# Patient Record
Sex: Female | Born: 1990 | Race: Black or African American | Hispanic: No | Marital: Married | Smoking: Never smoker
Health system: Southern US, Community
[De-identification: ages and names within clinical notes are randomized; demographics above are authoritative.]

## PROBLEM LIST (undated history)

## (undated) DIAGNOSIS — Z973 Presence of spectacles and contact lenses: Secondary | ICD-10-CM

## (undated) DIAGNOSIS — R55 Syncope and collapse: Secondary | ICD-10-CM

## (undated) DIAGNOSIS — F419 Anxiety disorder, unspecified: Secondary | ICD-10-CM

## (undated) DIAGNOSIS — K644 Residual hemorrhoidal skin tags: Secondary | ICD-10-CM

## (undated) DIAGNOSIS — D219 Benign neoplasm of connective and other soft tissue, unspecified: Secondary | ICD-10-CM

## (undated) DIAGNOSIS — D649 Anemia, unspecified: Secondary | ICD-10-CM

## (undated) DIAGNOSIS — O24419 Gestational diabetes mellitus in pregnancy, unspecified control: Secondary | ICD-10-CM

## (undated) HISTORY — PX: NO PAST SURGERIES: SHX2092

## (undated) HISTORY — DX: Residual hemorrhoidal skin tags: K64.4

## (undated) HISTORY — DX: Gestational diabetes mellitus in pregnancy, unspecified control: O24.419

## (undated) HISTORY — DX: Anemia, unspecified: D64.9

## (undated) HISTORY — DX: Benign neoplasm of connective and other soft tissue, unspecified: D21.9

---

## 2012-05-15 ENCOUNTER — Emergency Department (HOSPITAL_COMMUNITY): Payer: Self-pay

## 2012-05-15 ENCOUNTER — Emergency Department (HOSPITAL_COMMUNITY)
Admission: EM | Admit: 2012-05-15 | Discharge: 2012-05-16 | Disposition: A | Discharge status: Home / Self Care | Payer: Self-pay | Attending: Emergency Medicine | Admitting: Emergency Medicine

## 2012-05-15 DIAGNOSIS — S62609A Fracture of unspecified phalanx of unspecified finger, initial encounter for closed fracture: Secondary | ICD-10-CM | POA: Insufficient documentation

## 2012-05-15 DIAGNOSIS — Y9239 Other specified sports and athletic area as the place of occurrence of the external cause: Secondary | ICD-10-CM | POA: Insufficient documentation

## 2012-05-15 DIAGNOSIS — Y9389 Activity, other specified: Secondary | ICD-10-CM | POA: Insufficient documentation

## 2012-05-15 DIAGNOSIS — X500XXA Overexertion from strenuous movement or load, initial encounter: Secondary | ICD-10-CM | POA: Insufficient documentation

## 2012-05-15 DIAGNOSIS — Z79899 Other long term (current) drug therapy: Secondary | ICD-10-CM | POA: Insufficient documentation

## 2012-05-15 MED ORDER — HYDROCODONE-ACETAMINOPHEN 5-325 MG PO TABS
1.0000 | ORAL_TABLET | Freq: Once | ORAL | Status: AC
  Administered 2012-05-15: 1 via ORAL
  Filled 2012-05-15: qty 1

## 2012-05-15 MED ORDER — HYDROCODONE-ACETAMINOPHEN 5-325 MG PO TABS: 1.0000 | ORAL_TABLET | ORAL | Status: DC | PRN

## 2012-05-15 NOTE — ED Notes (Signed)
Pt states she was playing around and tried to get up and put all her weight on her R hand and injured her R index finger. Pt states she heard a pop. Pt concerned her finger is broken. No deformity noted. Pt declines ice pack, states it made her finger feel worse. Pt arrives with companion.

## 2012-05-15 NOTE — ED Provider Notes (Signed)
Medical screening examination/treatment/procedure(s) were conducted as a shared visit with non-physician practitioner(s) and myself.  I personally evaluated the patient during the encounter  Full ROM of finger. Volar plate fracture. Home with splint and buddy tape  Lyanne Co, MD 05/15/12 2359

## 2012-05-15 NOTE — ED Provider Notes (Signed)
History  This chart was scribed for non-physician practitioner working with Kelli Co, MD by Ardeen Jourdain, ED Scribe. This patient was seen in room WTR9/WTR9 and the patient's care was started at 2311.  CSN: [id number]  Arrival date & time 05/15/12  2234   First MD Initiated Contact with Patient 05/15/12 2311      Chief Complaint  Patient presents with  . Finger Injury     Patient is a 22 y.o. female presenting with hand pain. The history is provided by the patient. No language interpreter was used.  Hand Pain This is a new problem. The current episode started 1 to 2 hours ago. The problem occurs constantly. The problem has been gradually worsening. Pertinent negatives include no chest pain, no abdominal pain, no headaches and no shortness of breath. The symptoms are aggravated by bending and twisting (Ice). Nothing relieves the symptoms. She has tried a cold compress for the symptoms. The treatment provided no relief.    Kelli Cobb is a 22 y.o. female who presents to the Emergency Department complaining of sudden onset, gradually worsening, constant right index finger and hand pain from an accident that occurred a few hours ago. She states she was "playing around, tried to get up, put all her weight on her right hand and heard a pop." She denies any other injury at this time. She states she is unable to move her finger.  No past medical history on file.  No past surgical history on file.  No family history on file.  History  Substance Use Topics  . Smoking status: Not on file  . Smokeless tobacco: Not on file  . Alcohol Use: Not on file   No OB history available.   Review of Systems  Constitutional: Negative for fever and chills.  Respiratory: Negative for shortness of breath.   Cardiovascular: Negative for chest pain.  Gastrointestinal: Negative for nausea, vomiting and abdominal pain.  Musculoskeletal:       Right index finger  Neurological: Negative for weakness  and headaches.  All other systems reviewed and are negative.    Allergies  Review of patient's allergies indicates no known allergies.  Home Medications   Current Outpatient Rx  Name  Route  Sig  Dispense  Refill  . BIOTIN PO   Oral   Take 1 tablet by mouth daily.         Marland Kitchen HYDROcodone-acetaminophen (NORCO/VICODIN) 5-325 MG per tablet   Oral   Take 1 tablet by mouth every 4 (four) hours as needed for pain.   6 tablet   0     Triage Vitals: BP 138/87  Pulse 104  Temp(Src) 98.9 F (37.2 C) (Oral)  Resp 20  SpO2 100%  Physical Exam  Nursing note and vitals reviewed. Constitutional: She is oriented to person, place, and time. She appears well-developed and well-nourished. No distress.  HENT:  Head: Normocephalic and atraumatic.  Eyes: EOM are normal. Pupils are equal, round, and reactive to light.  Neck: Normal range of motion. Neck supple. No tracheal deviation present.  Cardiovascular: Normal rate.   Pulmonary/Chest: Effort normal. No respiratory distress.  Abdominal: Soft. She exhibits no distension.  Musculoskeletal: She exhibits no edema.       Right hand: She exhibits decreased range of motion, tenderness, bony tenderness and swelling. She exhibits normal two-point discrimination, normal capillary refill, no deformity and no laceration. Normal sensation noted. Normal strength noted.       Hands: Pt pain worse at  the MIP joint.  Neurological: She is alert and oriented to person, place, and time.  Skin: Skin is warm and dry.  Psychiatric: She has a normal mood and affect. Her behavior is normal.    ED Course  Procedures (including critical care time)  DIAGNOSTIC STUDIES: Oxygen Saturation is 100% on room air, normal by my interpretation.    COORDINATION OF CARE:  11:36 PM: iscussed treatment plan which includes x-ray of the right index finger with pt at bedside and pt agreed to plan.  Dr. Patria Mane came to see patient to help evaluate pt tendons intact. Which  they are. Pt refused digital block. Fingers buddy tapped. Pt education given.   Labs Reviewed - No data to display Dg Finger Index Right  05/15/2012  *RADIOLOGY REPORT*  Clinical Data: The patient fell on the right index finger and is unable to straighten it.  RIGHT INDEX FINGER 2+V  Comparison: None.  Findings: The proximal interphalangeal joint of the right second finger is fixed in flexion.  There is an avulsed volar plate fracture extending to the articular surface at the proximal aspect of the middle phalanx.  Mild soft tissue swelling.  No radiopaque soft tissue foreign bodies.  No focal bone lesion or bone destruction.  IMPRESSION: Intra-articular volar plate fracture of the proximal aspect of the middle phalanx of the right second finger with fixed flexion deformity of the proximal interphalangeal joint.   Original Report Authenticated By: Burman Nieves, M.D.      1. Finger fracture, closed, initial encounter       MDM  Pt has been advised of the symptoms that warrant their return to the ED. Patient has voiced understanding and has agreed to follow-up with the PCP or specialist.  I personally performed the services described in this documentation, which was scribed in my presence. The recorded information has been reviewed and is accurate.    Dorthula Matas, PA-C 05/15/12 2353

## 2012-11-12 ENCOUNTER — Emergency Department (HOSPITAL_COMMUNITY)
Admission: EM | Admit: 2012-11-12 | Discharge: 2012-11-12 | Disposition: A | Discharge status: Home / Self Care | Payer: No Typology Code available for payment source | Attending: Emergency Medicine | Admitting: Emergency Medicine

## 2012-11-12 DIAGNOSIS — R509 Fever, unspecified: Secondary | ICD-10-CM | POA: Insufficient documentation

## 2012-11-12 DIAGNOSIS — K029 Dental caries, unspecified: Secondary | ICD-10-CM | POA: Insufficient documentation

## 2012-11-12 DIAGNOSIS — K0889 Other specified disorders of teeth and supporting structures: Secondary | ICD-10-CM

## 2012-11-12 DIAGNOSIS — K0520 Aggressive periodontitis, unspecified: Secondary | ICD-10-CM | POA: Insufficient documentation

## 2012-11-12 MED ORDER — HYDROCODONE-ACETAMINOPHEN 5-325 MG PO TABS: 1.0000 | ORAL_TABLET | ORAL | Status: DC | PRN

## 2012-11-12 MED ORDER — PENICILLIN V POTASSIUM 500 MG PO TABS: 500.0000 mg | ORAL_TABLET | Freq: Three times a day (TID) | ORAL | Status: DC

## 2012-11-12 NOTE — ED Notes (Signed)
Pt c/o pain to R side bottom molar area of mouth. Pt states she has a cracked tooth and abscess to her gums. Pt states she has an appt in 3 weeks with dentist. Pt also c/o fever and chills today. Pt with no acute distress. Pt ambulatory to exam room with steady gait.

## 2012-11-12 NOTE — ED Provider Notes (Signed)
CSN: [id number]     Arrival date & time 11/12/12  1947 History  This chart was scribed for non-physician practitioner, Catie Tashanti Dalporto, PA-C working with Shon Baton, MD by Greggory Stallion, ED scribe. This patient was seen in room WTR7/WTR7 and the patient's care was started at 9:08 PM.   Chief Complaint  Patient presents with  . Dental Pain   The history is provided by the patient. No language interpreter was used.    HPI Comments: Kelli Cobb is a 22 y.o. female who presents to the Emergency Department complaining of gradual onset, constant right lower dental pain that started 4 days ago. Pt states she started getting a fever earlier today, max temp 100.0. She states she has a cracked tooth that caused an abscess. Pt is not sure if there is any drainage. She has taken Tylenol with no relief. Pt states palpation worsens the pain. She denies any other associated symptoms. Pt states she has a dentist appointment in 2-3 weeks to get the tooth pulled.   No past medical history on file. No past surgical history on file. No family history on file. History  Substance Use Topics  . Smoking status: Not on file  . Smokeless tobacco: Not on file  . Alcohol Use: Not on file   OB History   No data available     Review of Systems  Constitutional: Positive for fever.  HENT: Positive for dental problem.   All other systems reviewed and are negative.    Allergies  Review of patient's allergies indicates no known allergies.  Home Medications   Current Outpatient Rx  Name  Route  Sig  Dispense  Refill  . BIOTIN PO   Oral   Take 1 tablet by mouth daily.         Marland Kitchen HYDROcodone-acetaminophen (NORCO/VICODIN) 5-325 MG per tablet   Oral   Take 1 tablet by mouth every 4 (four) hours as needed for pain.   6 tablet   0    BP 130/74  Pulse 96  Temp(Src) 99.4 F (37.4 C) (Oral)  Resp 16  SpO2 100%  Physical Exam  Nursing note and vitals reviewed. Constitutional: She is oriented  to person, place, and time. She appears well-developed and well-nourished. No distress.  HENT:  Head: Normocephalic and atraumatic.  Right lower second molar has advanced carries and there is a 0.5 cm fluctuant abscess of adjacent gingiva. The tooth is tender to palpation. No edema of buccal mucosa.   Eyes: EOM are normal.  Neck: Neck supple. No tracheal deviation present.  Cardiovascular: Normal rate, regular rhythm and normal heart sounds.   Pulmonary/Chest: Effort normal and breath sounds normal. No respiratory distress. She has no wheezes. She has no rales.  Musculoskeletal: Normal range of motion.  Neurological: She is alert and oriented to person, place, and time.  Skin: Skin is warm and dry.  Psychiatric: She has a normal mood and affect. Her behavior is normal.    ED Course  NERVE BLOCK Date/Time: 11/12/2012 9:54 PM Performed by: Otilio Miu Authorized by: Ruby Cola E Consent: Verbal consent obtained. Risks and benefits: risks, benefits and alternatives were discussed Consent given by: patient Patient understanding: patient states understanding of the procedure being performed Indications: pain relief Body area: face/mouth (periapical) Laterality: right Patient sedated: no Local anesthetic: bupivacaine 0.5% with epinephrine Anesthetic total: 1.8 (1.8) ml Outcome: pain improved Patient tolerance: Patient tolerated the procedure well with no immediate complications. Comments: R lower second molar   (  including critical care time)  DIAGNOSTIC STUDIES: Oxygen Saturation is 100% on RA, normal by my interpretation.    COORDINATION OF CARE: 9:13 PM-Discussed treatment plan which includes dental block and I&D with pt at bedside and pt agreed to plan. Advised pt to keep her dentist appointment and follow up there.   Labs Review Labs Reviewed - No data to display Imaging Review No results found.  MDM   1. Pain, dental    22yo healthy F presents w/  abscess of gingiva, adjacent to right lower 2nd molar.  Periapical block performed successfully and needle aspiration of abscess performed.  Pt d/c'd home w/ penicillin and 10 vicodin.  Referred to dentist on call. Return precautions discussed. 9:53 PM    I personally performed the services described in this documentation, which was scribed in my presence. The recorded information has been reviewed and is accurate.   Otilio Miu, PA-C 11/12/12 2157

## 2012-11-13 NOTE — ED Provider Notes (Signed)
Medical screening examination/treatment/procedure(s) were performed by non-physician practitioner and as supervising physician I was immediately available for consultation/collaboration.  Courtney F Horton, MD 11/13/12 0900 

## 2012-11-19 ENCOUNTER — Encounter (HOSPITAL_BASED_OUTPATIENT_CLINIC_OR_DEPARTMENT_OTHER): Payer: Self-pay | Admitting: Emergency Medicine

## 2012-11-19 ENCOUNTER — Emergency Department (HOSPITAL_BASED_OUTPATIENT_CLINIC_OR_DEPARTMENT_OTHER)
Admission: EM | Admit: 2012-11-19 | Discharge: 2012-11-20 | Disposition: A | Discharge status: Home / Self Care | Payer: No Typology Code available for payment source | Attending: Emergency Medicine | Admitting: Emergency Medicine

## 2012-11-19 DIAGNOSIS — R0789 Other chest pain: Secondary | ICD-10-CM | POA: Insufficient documentation

## 2012-11-19 DIAGNOSIS — F410 Panic disorder [episodic paroxysmal anxiety] without agoraphobia: Secondary | ICD-10-CM | POA: Insufficient documentation

## 2012-11-19 DIAGNOSIS — R55 Syncope and collapse: Secondary | ICD-10-CM | POA: Insufficient documentation

## 2012-11-19 DIAGNOSIS — Z792 Long term (current) use of antibiotics: Secondary | ICD-10-CM | POA: Insufficient documentation

## 2012-11-19 DIAGNOSIS — R209 Unspecified disturbances of skin sensation: Secondary | ICD-10-CM | POA: Insufficient documentation

## 2012-11-19 DIAGNOSIS — R0602 Shortness of breath: Secondary | ICD-10-CM | POA: Insufficient documentation

## 2012-11-19 DIAGNOSIS — Z3202 Encounter for pregnancy test, result negative: Secondary | ICD-10-CM | POA: Insufficient documentation

## 2012-11-19 DIAGNOSIS — H538 Other visual disturbances: Secondary | ICD-10-CM | POA: Insufficient documentation

## 2012-11-19 HISTORY — DX: Syncope and collapse: R55

## 2012-11-19 HISTORY — DX: Anxiety disorder, unspecified: F41.9

## 2012-11-19 LAB — URINALYSIS, ROUTINE W REFLEX MICROSCOPIC
Bilirubin Urine: NEGATIVE
Leukocytes, UA: NEGATIVE
Nitrite: NEGATIVE
Specific Gravity, Urine: 1.024 (ref 1.005–1.030)
Urobilinogen, UA: 1 mg/dL (ref 0.0–1.0)
pH: 6.5 (ref 5.0–8.0)

## 2012-11-19 LAB — URINE MICROSCOPIC-ADD ON

## 2012-11-19 LAB — PREGNANCY, URINE: Preg Test, Ur: NEGATIVE
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
## 2012-11-20 MED ORDER — LORAZEPAM 1 MG PO TABS
1.0000 mg | ORAL_TABLET | Freq: Once | ORAL | Status: AC
  Administered 2012-11-20: 1 mg via ORAL
  Filled 2012-11-20: qty 1

## 2012-11-20 NOTE — ED Notes (Signed)
Pt d/c'ed at 00:23. Epic was taken offline at that time so unable to d/c pt until now.

## 2013-06-10 ENCOUNTER — Ambulatory Visit: Payer: Self-pay | Admitting: Physician Assistant

## 2015-03-12 ENCOUNTER — Ambulatory Visit (INDEPENDENT_AMBULATORY_CARE_PROVIDER_SITE_OTHER): Payer: Managed Care, Other (non HMO) | Admitting: Sports Medicine

## 2015-03-12 ENCOUNTER — Encounter: Payer: Self-pay | Admitting: Sports Medicine

## 2015-03-12 ENCOUNTER — Ambulatory Visit (INDEPENDENT_AMBULATORY_CARE_PROVIDER_SITE_OTHER): Payer: Managed Care, Other (non HMO)

## 2015-03-12 DIAGNOSIS — M2141 Flat foot [pes planus] (acquired), right foot: Secondary | ICD-10-CM

## 2015-03-12 DIAGNOSIS — M2142 Flat foot [pes planus] (acquired), left foot: Secondary | ICD-10-CM

## 2015-03-12 DIAGNOSIS — M79673 Pain in unspecified foot: Secondary | ICD-10-CM

## 2015-03-12 NOTE — Progress Notes (Signed)
Patient ID: Kelli Cobb, female   DOB: 08-22-1990, 25 y.o.   MRN: [id number] Subjective: Kelli Cobb is a 25 y.o. female patient who presents to office for evaluation of bilateral foot pain. Patient states that her feet in general, just her top to bottom. States that this has been going on for over 2 years. States that the pain is feels like achy sensation that involves her entire foot. States that sometimes it feels like she is walking on all her bones states that she hobble sometimes in the morning or either at night.Because her feet just hurt states that she has tried various types of shoes, padding, and OTC orthotics; states that the memory foam to help the most. Patient denies any other pedal complaints. Denies injury or any other causative factors.   Works at [employer]  There are no active problems to display for this patient.  Current Outpatient Prescriptions on File Prior to Visit  Medication Sig Dispense Refill  . acetaminophen (TYLENOL) 500 MG tablet Take 500 mg by mouth every 6 (six) hours as needed for pain.     No current facility-administered medications on file prior to visit.   No Known Allergies   Objective:  General: Alert and oriented x3 in no acute distress  Dermatology: No open lesions bilateral lower extremities, no webspace macerations, no ecchymosis bilateral, all nails x 10 are well manicured.  Vascular: Dorsalis Pedis and Posterior Tibial pedal pulses 2/4, Capillary Fill Time 3 seconds, (+) pedal hair growth bilateral, no edema bilateral lower extremities, Temperature gradient within normal limits.  Neurology: Gross sensation intact via light touch bilateral, No babinski sign present bilateral. (-) Tinels sign.  Musculoskeletal: No tenderness with palpation along medial arch, medial fascial band, No tenderness along Posterior tibial tendon course with medial soft tissue buldge noted, there is mild decreased ankle rom with knee extending  vs flexed resembling gastroc  equnius bilateral, Subtalar joint range of motion is within normal limits, there is no 1st ray hypermobility noted bilateral, there is medial arch collapse bilateral on weightbearing exam, rear foot neutral, no "too-many toes" sign appreciated, able to perform heel rise without pain or discomfort    Xrays  Left/Right Foot: Normal osseous mineralization. Joint spaces preserved. No fracture/dislocation/boney destruction. Mild 1st ray elevatus present. Minimally Increased Talar head uncovering present. Anterior break in cyma line without midtarsal breach present. Minimal Increased Talar declination present. Minimal Decreased calcaneal inclination present.  No soft tissue abnormalities or radiopaque foreign bodies.   Assessment and Plan: Problem List Items Addressed This Visit    None    Visit Diagnoses    Foot pain, unspecified laterality    -  Primary    Relevant Orders    DG Foot 2 Views Left    DG Foot 2 Views Right    Pes planus of both feet           -Complete examination performed -Xrays reviewed -Discussed treatement options; discussed pes planus deformity -Recommend good supportive shoes and inserts daily and to decrease use of wearing high heels, which can cause fatigue and other symptoms to feet -Recommend custom foot orthotics. Patient was scanned today. We will hold prescription until a cost breakdown for orthotics is given to patient. Patient will like to know the cost of orthotics before sending the prescription to [place] lab. -Patient to return to office to pick up orthotics/ as needed or sooner if condition worsens.  Landis Martins, DPM

## 2015-03-15 ENCOUNTER — Telehealth: Payer: Self-pay | Admitting: Sports Medicine

## 2015-03-15 NOTE — Telephone Encounter (Signed)
03/15/2015- called [employer] and verified orthotic benefits for L3020/ L3030. Both are eligible for benefits, no pre cert required, unlimited amount based on medical necessity. The plan pays 80% of allowed charge, patient liab for 20% coins.  Called pt on cell and left her vm to call us and advise if she wants [place] to go ahead and place the order for these.

## 2015-03-15 NOTE — Telephone Encounter (Signed)
Thank you -Dr. Nasreen Goedecke 

## 2016-01-08 ENCOUNTER — Encounter: Payer: Self-pay | Admitting: Emergency Medicine

## 2016-01-08 ENCOUNTER — Observation Stay
Admission: EM | Admit: 2016-01-08 | Discharge: 2016-01-09 | Disposition: A | Discharge status: Home / Self Care | Payer: Managed Care, Other (non HMO) | Attending: Internal Medicine | Admitting: Internal Medicine

## 2016-01-08 ENCOUNTER — Emergency Department: Payer: Managed Care, Other (non HMO)

## 2016-01-08 DIAGNOSIS — R000 Tachycardia, unspecified: Secondary | ICD-10-CM | POA: Diagnosis not present

## 2016-01-08 DIAGNOSIS — R0781 Pleurodynia: Secondary | ICD-10-CM | POA: Diagnosis not present

## 2016-01-08 DIAGNOSIS — F419 Anxiety disorder, unspecified: Secondary | ICD-10-CM | POA: Insufficient documentation

## 2016-01-08 DIAGNOSIS — R079 Chest pain, unspecified: Secondary | ICD-10-CM | POA: Diagnosis present

## 2016-01-08 DIAGNOSIS — Z833 Family history of diabetes mellitus: Secondary | ICD-10-CM | POA: Insufficient documentation

## 2016-01-08 DIAGNOSIS — R0602 Shortness of breath: Secondary | ICD-10-CM | POA: Diagnosis not present

## 2016-01-08 DIAGNOSIS — Z8249 Family history of ischemic heart disease and other diseases of the circulatory system: Secondary | ICD-10-CM | POA: Diagnosis not present

## 2016-01-08 DIAGNOSIS — I10 Essential (primary) hypertension: Secondary | ICD-10-CM | POA: Diagnosis not present

## 2016-01-08 LAB — TROPONIN I: Troponin I: <0.03 ng/mL (ref <0.03)

## 2016-01-08 LAB — COMPREHENSIVE METABOLIC PANEL
ALBUMIN: 3.8 g/dL (ref 3.5–5.0)
ALK PHOS: 50 U/L (ref 38–126)
ALT: 25 U/L (ref 14–54)
ANION GAP: 7 (ref 5–15)
AST: 26 U/L (ref 15–41)
BILIRUBIN TOTAL: 0.4 mg/dL (ref 0.3–1.2)
BUN: 8 mg/dL (ref 6–20)
CALCIUM: 8.9 mg/dL (ref 8.9–10.3)
CO2: 25 mmol/L (ref 22–32)
CREATININE: 0.68 mg/dL (ref 0.44–1.00)
Chloride: 104 mmol/L (ref 101–111)
GFR calc Af Amer: >60 mL/min (ref >60)
GFR calc non Af Amer: >60 mL/min (ref >60)
GLUCOSE: 134 mg/dL — AB (ref 65–99)
Potassium: 3.7 mmol/L (ref 3.5–5.1)
SODIUM: 136 mmol/L (ref 135–145)
TOTAL PROTEIN: 7.2 g/dL (ref 6.5–8.1)

## 2016-01-08 LAB — CBC
HEMATOCRIT: 42.1 % (ref 35.0–47.0)
HEMOGLOBIN: 14 g/dL (ref 12.0–16.0)
MCH: 26.3 pg (ref 26.0–34.0)
MCHC: 33.3 g/dL (ref 32.0–36.0)
MCV: 79 fL — ABNORMAL LOW (ref 80.0–100.0)
Platelets: 306 10*3/uL (ref 150–440)
RBC: 5.33 MIL/uL — AB (ref 3.80–5.20)
RDW: 14.4 % (ref 11.5–14.5)
WBC: 4.7 10*3/uL (ref 3.6–11.0)

## 2016-01-08 LAB — POCT PREGNANCY, URINE: PREG TEST UR: NEGATIVE

## 2016-01-08 LAB — FIBRIN DERIVATIVES D-DIMER (ARMC ONLY): Fibrin derivatives D-dimer (ARMC): 220 (ref 0–499)

## 2016-01-08 MED ORDER — ONDANSETRON HCL 4 MG/2ML IJ SOLN: 4.0000 mg | Freq: Four times a day (QID) | INTRAMUSCULAR | Status: DC | PRN

## 2016-01-08 MED ORDER — HYDROCODONE-ACETAMINOPHEN 5-325 MG PO TABS
1.0000 | ORAL_TABLET | ORAL | Status: DC | PRN
  Administered 2016-01-08: 1 via ORAL
  Filled 2016-01-08: qty 1

## 2016-01-08 MED ORDER — ALBUTEROL SULFATE (2.5 MG/3ML) 0.083% IN NEBU: 2.5000 mg | INHALATION_SOLUTION | RESPIRATORY_TRACT | Status: DC | PRN

## 2016-01-08 MED ORDER — IOPAMIDOL (ISOVUE-370) INJECTION 76%
75.0000 mL | Freq: Once | INTRAVENOUS | Status: AC | PRN
  Administered 2016-01-08: 75 mL via INTRAVENOUS

## 2016-01-08 MED ORDER — ACETAMINOPHEN 650 MG RE SUPP: 650.0000 mg | Freq: Four times a day (QID) | RECTAL | Status: DC | PRN

## 2016-01-08 MED ORDER — METOPROLOL TARTRATE 25 MG PO TABS
12.5000 mg | ORAL_TABLET | Freq: Two times a day (BID) | ORAL | Status: DC
  Administered 2016-01-08 – 2016-01-09 (×2): 12.5 mg via ORAL
  Filled 2016-01-08: qty 1
  Filled 2016-01-08: qty 2
  Filled 2016-01-08: qty 1

## 2016-01-08 MED ORDER — IPRATROPIUM-ALBUTEROL 0.5-2.5 (3) MG/3ML IN SOLN
3.0000 mL | Freq: Once | RESPIRATORY_TRACT | Status: AC
  Administered 2016-01-08: 3 mL via RESPIRATORY_TRACT
  Filled 2016-01-08: qty 3

## 2016-01-08 MED ORDER — SODIUM CHLORIDE 0.9 % IV SOLN: 250.0000 mL | INTRAVENOUS | Status: DC | PRN

## 2016-01-08 MED ORDER — ONDANSETRON HCL 4 MG PO TABS: 4.0000 mg | ORAL_TABLET | Freq: Four times a day (QID) | ORAL | Status: DC | PRN

## 2016-01-08 MED ORDER — SODIUM CHLORIDE 0.9% FLUSH: 3.0000 mL | INTRAVENOUS | Status: DC | PRN

## 2016-01-08 MED ORDER — SODIUM CHLORIDE 0.9% FLUSH: 3.0000 mL | Freq: Two times a day (BID) | INTRAVENOUS | Status: DC

## 2016-01-08 MED ORDER — HEPARIN SODIUM (PORCINE) 5000 UNIT/ML IJ SOLN
5000.0000 [IU] | Freq: Three times a day (TID) | INTRAMUSCULAR | Status: DC
  Administered 2016-01-08 – 2016-01-09 (×2): 5000 [IU] via SUBCUTANEOUS
  Filled 2016-01-08 (×2): qty 1

## 2016-01-08 MED ORDER — SODIUM CHLORIDE 0.9% FLUSH
3.0000 mL | Freq: Two times a day (BID) | INTRAVENOUS | Status: DC
  Administered 2016-01-08 – 2016-01-09 (×2): 3 mL via INTRAVENOUS

## 2016-01-08 MED ORDER — ACETAMINOPHEN 325 MG PO TABS: 650.0000 mg | ORAL_TABLET | Freq: Four times a day (QID) | ORAL | Status: DC | PRN

## 2016-01-08 MED ORDER — KETOROLAC TROMETHAMINE 30 MG/ML IJ SOLN
30.0000 mg | Freq: Four times a day (QID) | INTRAMUSCULAR | Status: DC | PRN
  Administered 2016-01-09: 30 mg via INTRAVENOUS
  Filled 2016-01-08 (×2): qty 1

## 2016-01-08 MED ORDER — KETOROLAC TROMETHAMINE 30 MG/ML IJ SOLN
30.0000 mg | Freq: Once | INTRAMUSCULAR | Status: AC
  Administered 2016-01-08: 30 mg via INTRAVENOUS
  Filled 2016-01-08: qty 1

## 2016-01-08 NOTE — H&P (Signed)
[place] [facility] [place] NAME: Kelli Cobb    MR#:  [id number]  DATE OF BIRTH:  [birth year]  DATE OF ADMISSION:  01/08/2016  PRIMARY CARE PHYSICIAN: No PCP Per Patient   REQUESTING/REFERRING PHYSICIAN: Lavonia Drafts, MD  CHIEF COMPLAINT:   Chief Complaint  Patient presents with  . Chest Pain   Chest pain today. HISTORY OF PRESENT ILLNESS:  Kelli Cobb  is a 25 y.o. female with a known history of anxiety. she presents with complaints of chest tightness and shortness of breath. Chest pain is on left side, radiate to left back, exacerbated by movement and breathing. CT angiogram did not show PE. Troponin is normal. She denies anxiety.  PAST MEDICAL HISTORY:   Past Medical History:  Diagnosis Date  . Anxiety   . Syncope     PAST SURGICAL HISTORY:  History reviewed. No pertinent surgical history.  SOCIAL HISTORY:   Social History  Substance Use Topics  . Smoking status: Never Smoker  . Smokeless tobacco: Never Used  . Alcohol use No    FAMILY HISTORY:   Family History  Problem Relation Age of Onset  . Clotting disorder Mother   . Diabetes Mother   . Hypertension Mother   . Diabetes Father   . Hypertension Father     DRUG ALLERGIES:  No Known Allergies  REVIEW OF SYSTEMS:   Review of Systems  Constitutional: Negative for chills and fever.  HENT: Negative for congestion and sinus pain.   Eyes: Negative for blurred vision and double vision.  Respiratory: Negative for cough, hemoptysis, shortness of breath, wheezing and stridor.   Cardiovascular: Positive for chest pain. Negative for palpitations and leg swelling.  Gastrointestinal: Negative for abdominal pain, blood in stool, diarrhea, melena, nausea and vomiting.  Genitourinary: Negative for dysuria, hematuria and urgency.  Musculoskeletal: Positive for back pain.  Skin: Negative for itching and rash.  Neurological: Negative for dizziness and focal weakness.    Psychiatric/Behavioral: Negative for depression. The patient is not nervous/anxious.     MEDICATIONS AT HOME:   Prior to Admission medications   Medication Sig Start Date End Date Taking? Authorizing Provider  amoxicillin (AMOXIL) 500 MG tablet Take 500 mg by mouth 3 (three) times daily.   Yes Historical Provider, MD  MedroxyPROGESTERone Acetate 150 MG/ML SUSY INJ 1 SYRINGE IM Q 3 MONTHS 02/10/15   Historical Provider, MD      VITAL SIGNS:  Blood pressure 123/84, pulse 97, temperature 98.6 F (37 C), temperature source Oral, resp. rate (!) 25, height 5\' 7"  (1.702 m), weight 200 lb (90.7 kg), last menstrual period 01/08/2015, SpO2 100 %.  PHYSICAL EXAMINATION:  Physical Exam  GENERAL:  25 y.o.-year-old patient lying in the bed with no acute distress.  EYES: Pupils equal, round, reactive to light and accommodation. No scleral icterus. Extraocular muscles intact.  HEENT: Head atraumatic, normocephalic. Oropharynx and nasopharynx clear.  NECK:  Supple, no jugular venous distention. No thyroid enlargement, no tenderness.  LUNGS: Normal breath sounds bilaterally, no wheezing, rales,rhonchi or crepitation. No use of accessory muscles of respiration.  CARDIOVASCULAR: S1, S2 normal. No murmurs, rubs, or gallops.  ABDOMEN: Soft, nontender, nondistended. Bowel sounds present. No organomegaly or mass.  EXTREMITIES: No pedal edema, cyanosis, or clubbing.  NEUROLOGIC: Cranial nerves II through XII are intact. Muscle strength 5/5 in all extremities. Sensation intact. Gait not checked.  PSYCHIATRIC: The patient is alert and oriented x 3.  SKIN: No obvious rash, lesion, or ulcer.  LABORATORY PANEL:   CBC  Recent Labs Lab 01/08/16 1049  WBC 4.7  HGB 14.0  HCT 42.1  PLT 306   ------------------------------------------------------------------------------------------------------------------  Chemistries   Recent Labs Lab 01/08/16 1049  NA 136  K 3.7  CL 104  CO2 25  GLUCOSE 134*   BUN 8  CREATININE 0.68  CALCIUM 8.9  AST 26  ALT 25  ALKPHOS 50  BILITOT 0.4   ------------------------------------------------------------------------------------------------------------------  Cardiac Enzymes  Recent Labs Lab 01/08/16 1049  TROPONINI <0.03   ------------------------------------------------------------------------------------------------------------------  RADIOLOGY:  Dg Chest 2 View  Result Date: 01/08/2016 CLINICAL DATA:  Shortness of breath this morning, chest pain EXAM: CHEST  2 VIEW COMPARISON:  None. FINDINGS: Cardiomediastinal silhouette is unremarkable. No acute infiltrate or pleural effusion. No pulmonary edema. Bony thorax is unremarkable. IMPRESSION: No active cardiopulmonary disease. Electronically Signed   By: Lahoma Crocker M.D.   On: 01/08/2016 11:07   Ct Angio Chest Pe W And/or Wo Contrast  Result Date: 01/08/2016 CLINICAL DATA:  25 year old female with a history of chest pain and shortness of breath EXAM: CT ANGIOGRAPHY CHEST WITH CONTRAST TECHNIQUE: Multidetector CT imaging of the chest was performed using the standard protocol during bolus administration of intravenous contrast. Multiplanar CT image reconstructions and MIPs were obtained to evaluate the vascular anatomy. CONTRAST:  75 cc Isovue 370 COMPARISON:  Chest x-ray 01/08/2016 FINDINGS: Cardiovascular: Heart: No cardiomegaly. No pericardial fluid/thickening. No significant coronary calcifications. Aorta: Unremarkable course, caliber, contour of the thoracic aorta. No aneurysm or dissection flap. No periaortic fluid. Pulmonary arteries: No central, lobar, segmental, or proximal subsegmental filling defects. Beyond this, study is is limited by the timing of the contrast bolus and motion artifact. Mediastinum/Nodes: Mediastinal lymph nodes are present, none of which are enlarged by CT size criteria. Unremarkable appearance of the thoracic esophagus. Soft tissue present within the mediastinum, with  configuration is most typical of residual thymus. Unremarkable appearance of the thoracic inlet and thyroid. Lungs/Pleura: Central airways are clear. No pleural effusion. No confluent left-sided airspace disease. No pneumothorax.  Atelectasis/scarring at the lung bases. Upper Abdomen: Unremarkable. Musculoskeletal: No displaced fracture. Degenerative changes of the spine. Review of the MIP images confirms the above findings. IMPRESSION: Study is negative for pulmonary emboli. No acute finding to account for the patient's symptoms. Soft tissue in the anterior mediastinum which is most compatible with residual thymus in a female patient of this age. Signed, Dulcy Fanny. Earleen Newport, DO [facility] Electronically Signed   By: Corrie Mckusick D.O.   On: 01/08/2016 13:42      IMPRESSION AND PLAN:   Pleural chest pain. Pain control. F/u troponin.  Tachycardia, tele monitor.  HTN. Due to above.  Lopressor bid.  All the records are reviewed and case discussed with ED provider. Management plans discussed with the patient, family and they are in agreement.  CODE STATUS: full code.  TOTAL TIME TAKING CARE OF THIS PATIENT:46 minutes.    Demetrios Loll M.D on 01/08/2016 at 3:16 PM  Between 7am to 6pm - Pager - [phone]  After 6pm go to [email or web address] - [employer] [facility]  Office  [phone]  CC: Primary care physician; No PCP Per Patient   Note: This dictation was prepared with Dragon dictation along with smaller phrase technology. Any transcriptional errors that result from this process are unintentional.

## 2016-01-08 NOTE — ED Triage Notes (Signed)
Yesterday started with back pain, today feels like she cannot take a deep breath and chest pain. Denies cough. Denies leg swelling.

## 2016-01-08 NOTE — ED Provider Notes (Signed)
[facility] Emergency Department Provider Note   ____________________________________________    I have reviewed the triage vital signs and the nursing notes.   HISTORY  Chief Complaint Chest Pain     HPI Kelli Cobb is a 25 y.o. female who presents with complaints of chest tightness and shortness of breath. Patient reports yesterday she developed pain in her lower back and then today it seemed to spread to her upper back and she feels slightly short of breath. She states her chest feels tight and she does have some mild pleurisy. No fevers or chills. No cough. No history of asthma. Nonsmoker. She reports she did travel by car 3 hours recently and did have a Charlie horse 2 days ago in her right leg. No history of blood clots, she is on Depo Provera.   Past Medical History:  Diagnosis Date  . Anxiety   . Syncope     There are no active problems to display for this patient.   History reviewed. No pertinent surgical history.  Prior to Admission medications   Medication Sig Start Date End Date Taking? Authorizing Provider  acetaminophen (TYLENOL) 500 MG tablet Take 500 mg by mouth every 6 (six) hours as needed for pain.    Historical Provider, MD  dicyclomine (BENTYL) 10 MG capsule TK 1 C PO TID 02/10/15   Historical Provider, MD  MedroxyPROGESTERone Acetate 150 MG/ML SUSY INJ 1 SYRINGE IM Q 3 MONTHS 02/10/15   Historical Provider, MD     Allergies Patient has no known allergies.  No family history on file.  Social History Social History  Substance Use Topics  . Smoking status: Never Smoker  . Smokeless tobacco: Never Used  . Alcohol use Not on file    Review of Systems  Constitutional: No fever/chills Eyes: No visual changes.   Cardiovascular: As above Respiratory: As above Gastrointestinal: No abdominal pain.  No nausea, no vomiting.    Musculoskeletal: As above Skin: Negative for rash. Neurological: Negative for headaches or  weakness  10-point ROS otherwise negative.  ____________________________________________   PHYSICAL EXAM:  VITAL SIGNS: ED Triage Vitals  Enc Vitals Group     BP 01/08/16 1041 (!) 163/80     Pulse Rate 01/08/16 1041 100     Resp 01/08/16 1041 20     Temp 01/08/16 1041 98.6 F (37 C)     Temp Source 01/08/16 1041 Oral     SpO2 01/08/16 1041 99 %     Weight 01/08/16 1042 200 lb (90.7 kg)     Height 01/08/16 1042 5\' 7"  (1.702 m)     Head Circumference --      Peak Flow --      Pain Score 01/08/16 1042 10     Pain Loc --      Pain Edu? --      Excl. in [place]? --     Constitutional: Alert and oriented. No acute distress. Pleasant and interactive Eyes: Conjunctivae are normal.   Nose: No congestion/rhinnorhea. Mouth/Throat: Mucous membranes are moist.    Cardiovascular: Normal rate, regular rhythm. Grossly normal heart sounds.  Good peripheral circulation. Respiratory: Normal respiratory effort.  No retractions. Lungs CTAB. Gastrointestinal: Soft and nontender. No distention.  No CVA tenderness. Genitourinary: deferred Musculoskeletal: No lower extremity tenderness nor edema.  Warm and well perfused Neurologic:  Normal speech and language. No gross focal neurologic deficits are appreciated.  Skin:  Skin is warm, dry and intact. No rash noted. Psychiatric: Mood  and affect are normal. Speech and behavior are normal.  ____________________________________________   LABS (all labs ordered are listed, but only abnormal results are displayed)  Labs Reviewed  CBC - Abnormal; Notable for the following:       Result Value   RBC 5.33 (*)    MCV 79.0 (*)    All other components within normal limits  COMPREHENSIVE METABOLIC PANEL - Abnormal; Notable for the following:    Glucose, Bld 134 (*)    All other components within normal limits  TROPONIN I  FIBRIN DERIVATIVES D-DIMER ([facility] ONLY)   ____________________________________________  EKG  ED ECG REPORT I, Lavonia Drafts,  the attending physician, personally viewed and interpreted this ECG.  Date: 01/08/2016 EKG Time: 10:49 AM Rate: 99 Rhythm: normal sinus rhythm QRS Axis: normal Intervals: normal ST/T Wave abnormalities: Nonspecific Conduction Disturbances: none   ____________________________________________  RADIOLOGY  Chest x-ray unremarkable CT angiography of the chest shows no PE ____________________________________________   PROCEDURES  Procedure(s) performed: No    Critical Care performed:No ____________________________________________   INITIAL IMPRESSION / ASSESSMENT AND PLAN / ED COURSE  Pertinent labs & imaging results that were available during my care of the patient were reviewed by me and considered in my medical decision making (see chart for details).  Patient presents with upper back pain and chest discomfort, left greater than right. Pain started in her lower back which suggests a musculoskeletal cause however also a history of a Charlie horse 2 days ago after a car ride could suggest PE. D-dimer sent. Initial labs are unremarkable. EKG nonspecific. Patient very low risk for ACS.  Clinical Course   Patient's d-dimer is normal however symptoms are concerning for PE, I will proceed with CT scan  ----------------------------------------- 2:44 PM on 01/08/2016 -----------------------------------------  CT scan negative for PE, we ambulated the patient and she had significant chest pain with exertion. Unclear cause of this chest pain, discussed with the hospitalist for admission for further evaluation and observation ____________________________________________   FINAL CLINICAL IMPRESSION(S) / ED DIAGNOSES  Final diagnoses:  Chest pain, unspecified type      NEW MEDICATIONS STARTED DURING THIS VISIT:  New Prescriptions   No medications on file     Note:  This document was prepared using Dragon voice recognition software and may include unintentional dictation  errors.    Lavonia Drafts, MD 01/08/16 [phone]

## 2016-01-08 NOTE — ED Notes (Signed)
ED Provider at bedside giving update on imaging studies

## 2016-01-09 NOTE — Discharge Summary (Signed)
[place] [facility] [place] NAME: Kelli Cobb    MR#:  [id number]  DATE OF BIRTH:  05/29/1990  DATE OF ADMISSION:  01/08/2016 ADMITTING PHYSICIAN: Demetrios Loll, MD  DATE OF DISCHARGE: 01/09/2016  PRIMARY CARE PHYSICIAN: No PCP Per Patient    ADMISSION DIAGNOSIS:  Chest pain, unspecified type [R07.9]  DISCHARGE DIAGNOSIS:  Active Problems:   Chest pain   SECONDARY DIAGNOSIS:   Past Medical History:  Diagnosis Date  . Anxiety   . Syncope     HOSPITAL COURSE:   25 -year-old female that presented with atypical chest pain.  1.pleuritic chest pain with negative chest for pulmonary emboli/heart failure or pneumonia. Symptoms most consistent with pleurisy. Troponins are negative and telemetry showed no acute changes.     DISCHARGE CONDITIONS AND DIET:  Stable Heart healthy diet  CONSULTS OBTAINED:    DRUG ALLERGIES:  No Known Allergies  DISCHARGE MEDICATIONS:   Current Discharge Medication List    CONTINUE these medications which have NOT CHANGED   Details  MedroxyPROGESTERone Acetate 150 MG/ML SUSY INJ 1 SYRINGE IM Q 3 MONTHS Refills: 2      STOP taking these medications     amoxicillin (AMOXIL) 500 MG tablet               Today   CHIEF COMPLAINT:  Feeling better this am No fevers or chills over night   VITAL SIGNS:  Blood pressure 123/81, pulse 91, temperature 98.2 F (36.8 C), temperature source Oral, resp. rate 20, height 5\' 7"  (1.702 m), weight 90.7 kg (200 lb), last menstrual period 01/08/2015, SpO2 100 %.   REVIEW OF SYSTEMS:  Review of Systems  Constitutional: Negative.  Negative for chills, fever and malaise/fatigue.  HENT: Negative.  Negative for ear discharge, ear pain, hearing loss, nosebleeds and sore throat.   Eyes: Negative.  Negative for blurred vision and pain.  Respiratory: Negative.  Negative for cough, hemoptysis, shortness of breath and wheezing.   Cardiovascular: Positive for  chest pain. Negative for palpitations and leg swelling.  Gastrointestinal: Negative.  Negative for abdominal pain, blood in stool, diarrhea, nausea and vomiting.  Genitourinary: Negative.  Negative for dysuria.  Musculoskeletal: Negative.  Negative for back pain.  Skin: Negative.   Neurological: Negative for dizziness, tremors, speech change, focal weakness, seizures and headaches.  Endo/Heme/Allergies: Negative.  Does not bruise/bleed easily.  Psychiatric/Behavioral: Negative.  Negative for depression, hallucinations and suicidal ideas.     PHYSICAL EXAMINATION:  GENERAL:  25 y.o.-year-old patient lying in the bed with no acute distress.  NECK:  Supple, no jugular venous distention. No thyroid enlargement, no tenderness.  LUNGS: Normal breath sounds bilaterally, no wheezing, rales,rhonchi  No use of accessory muscles of respiration.  CARDIOVASCULAR: S1, S2 normal. No murmurs, rubs, or gallops.  ABDOMEN: Soft, non-tender, non-distended. Bowel sounds present. No organomegaly or mass.  EXTREMITIES: No pedal edema, cyanosis, or clubbing.  PSYCHIATRIC: The patient is alert and oriented x 3.  SKIN: No obvious rash, lesion, or ulcer.   DATA REVIEW:   CBC  Recent Labs Lab 01/08/16 1049  WBC 4.7  HGB 14.0  HCT 42.1  PLT 306    Chemistries   Recent Labs Lab 01/08/16 1049  NA 136  K 3.7  CL 104  CO2 25  GLUCOSE 134*  BUN 8  CREATININE 0.68  CALCIUM 8.9  AST 26  ALT 25  ALKPHOS 50  BILITOT 0.4    Cardiac Enzymes  Recent Labs Lab  01/08/16 1049 01/08/16 1657 01/08/16 2318  TROPONINI <0.03 <0.03 <0.03    Microbiology Results  @[email or web address] @  RADIOLOGY:  Dg Chest 2 View  Result Date: 01/08/2016 CLINICAL DATA:  Shortness of breath this morning, chest pain EXAM: CHEST  2 VIEW COMPARISON:  None. FINDINGS: Cardiomediastinal silhouette is unremarkable. No acute infiltrate or pleural effusion. No pulmonary edema. Bony thorax is unremarkable. IMPRESSION: No active  cardiopulmonary disease. Electronically Signed   By: Lahoma Crocker M.D.   On: 01/08/2016 11:07   Ct Angio Chest Pe W And/or Wo Contrast  Result Date: 01/08/2016 CLINICAL DATA:  25 year old female with a history of chest pain and shortness of breath EXAM: CT ANGIOGRAPHY CHEST WITH CONTRAST TECHNIQUE: Multidetector CT imaging of the chest was performed using the standard protocol during bolus administration of intravenous contrast. Multiplanar CT image reconstructions and MIPs were obtained to evaluate the vascular anatomy. CONTRAST:  75 cc Isovue 370 COMPARISON:  Chest x-ray 01/08/2016 FINDINGS: Cardiovascular: Heart: No cardiomegaly. No pericardial fluid/thickening. No significant coronary calcifications. Aorta: Unremarkable course, caliber, contour of the thoracic aorta. No aneurysm or dissection flap. No periaortic fluid. Pulmonary arteries: No central, lobar, segmental, or proximal subsegmental filling defects. Beyond this, study is is limited by the timing of the contrast bolus and motion artifact. Mediastinum/Nodes: Mediastinal lymph nodes are present, none of which are enlarged by CT size criteria. Unremarkable appearance of the thoracic esophagus. Soft tissue present within the mediastinum, with configuration is most typical of residual thymus. Unremarkable appearance of the thoracic inlet and thyroid. Lungs/Pleura: Central airways are clear. No pleural effusion. No confluent left-sided airspace disease. No pneumothorax.  Atelectasis/scarring at the lung bases. Upper Abdomen: Unremarkable. Musculoskeletal: No displaced fracture. Degenerative changes of the spine. Review of the MIP images confirms the above findings. IMPRESSION: Study is negative for pulmonary emboli. No acute finding to account for the patient's symptoms. Soft tissue in the anterior mediastinum which is most compatible with residual thymus in a female patient of this age. Signed, Dulcy Fanny. Earleen Newport, DO [facility]  [facility] Radiology Electronically Signed   By: Corrie Mckusick D.O.   On: 01/08/2016 13:42      Management plans discussed with the patient and she is in agreement. Stable for discharge hme  Patient should follow up with pcp (she will find a PCP to follow up with )  CODE STATUS:     Code Status Orders        Start     Ordered   01/08/16 1641  Full code  Continuous     01/08/16 1640    Code Status History    Date Active Date Inactive Code Status Order ID Comments User Context   This patient has a current code status but no historical code status.      TOTAL TIME TAKING CARE OF THIS PATIENT: 34 minutes.    Note: This dictation was prepared with Dragon dictation along with smaller phrase technology. Any transcriptional errors that result from this process are unintentional.  Akili Cuda M.D on 01/09/2016 at 8:13 AM  Between 7am to 6pm - Pager - [phone] After 6pm go to [email or web address] - password [email or web address] [facility]  Office  [phone]  CC: Primary care physician; No PCP Per Patient

## 2016-01-09 NOTE — Progress Notes (Signed)
Patient discharged home per MD order and hospital protocol. Patient verbalized understanding of discharge instructions. No prescriptions or follow up appointments were given by MD. Patient escorted off the unit via wheelchair to visitor lobby.

## 2016-02-10 ENCOUNTER — Encounter: Payer: Self-pay | Admitting: Emergency Medicine

## 2016-02-10 ENCOUNTER — Emergency Department
Admission: EM | Admit: 2016-02-10 | Discharge: 2016-02-10 | Disposition: A | Discharge status: Home / Self Care | Payer: Managed Care, Other (non HMO) | Attending: Emergency Medicine | Admitting: Emergency Medicine

## 2016-02-10 DIAGNOSIS — Z79899 Other long term (current) drug therapy: Secondary | ICD-10-CM | POA: Diagnosis not present

## 2016-02-10 DIAGNOSIS — Z791 Long term (current) use of non-steroidal anti-inflammatories (NSAID): Secondary | ICD-10-CM | POA: Diagnosis not present

## 2016-02-10 DIAGNOSIS — J029 Acute pharyngitis, unspecified: Secondary | ICD-10-CM | POA: Diagnosis present

## 2016-02-10 DIAGNOSIS — B349 Viral infection, unspecified: Secondary | ICD-10-CM

## 2016-02-10 LAB — POCT RAPID STREP A: Streptococcus, Group A Screen (Direct): NEGATIVE

## 2016-02-10 MED ORDER — KETOROLAC TROMETHAMINE 60 MG/2ML IM SOLN
60.0000 mg | Freq: Once | INTRAMUSCULAR | Status: AC
  Administered 2016-02-10: 60 mg via INTRAMUSCULAR
  Filled 2016-02-10: qty 2

## 2016-02-10 MED ORDER — PREDNISOLONE SODIUM PHOSPHATE 15 MG/5ML PO SOLN
30.0000 mg | Freq: Once | ORAL | Status: AC
  Administered 2016-02-10: 30 mg via ORAL
  Filled 2016-02-10: qty 10

## 2016-02-10 MED ORDER — LIDOCAINE VISCOUS 2 % MT SOLN
15.0000 mL | Freq: Once | OROMUCOSAL | Status: AC
  Administered 2016-02-10: 15 mL via OROMUCOSAL
  Filled 2016-02-10: qty 15

## 2016-02-10 MED ORDER — DIPHENHYDRAMINE HCL 12.5 MG/5ML PO ELIX
25.0000 mg | ORAL_SOLUTION | Freq: Once | ORAL | Status: AC
  Administered 2016-02-10: 25 mg via ORAL
  Filled 2016-02-10: qty 10

## 2016-02-10 MED ORDER — PSEUDOEPH-BROMPHEN-DM 30-2-10 MG/5ML PO SYRP: 5.0000 mL | ORAL_SOLUTION | Freq: Four times a day (QID) | ORAL | 0 refills | Status: DC | PRN

## 2016-02-10 MED ORDER — FIRST-DUKES MOUTHWASH MT SUSP: 10.0000 mL | Freq: Four times a day (QID) | OROMUCOSAL | 0 refills | Status: DC

## 2016-02-10 NOTE — ED Provider Notes (Signed)
[facility] Emergency Department Provider Note  ____________________________________________   First MD Initiated Contact with Patient 02/10/16 [phone]     (approximate)  I have reviewed the triage vital signs and the nursing notes.   HISTORY  Chief Complaint Sore Throat    HPI Kelli Cobb is a 25 y.o. female patient complaining of 3 days viral illness since of sore throat, cough, and body aches. Patient states she was seen at urgent care yesterday with diagnosis negative for flu. Patient stated sore throat increased overnight. Patient state increased pain with swallowing but can tolerate food and fluids. Patient denies any nausea vomiting diarrhea. Patient currently is taking ibuprofen 200 mg and Tessalon Perles and 100 mg.   Past Medical History:  Diagnosis Date  . Anxiety   . Syncope     Patient Active Problem List   Diagnosis Date Noted  . Chest pain 01/08/2016    History reviewed. No pertinent surgical history.  Prior to Admission medications   Medication Sig Start Date End Date Taking? Authorizing Provider  benzonatate (TESSALON) 200 MG capsule Take 200 mg by mouth 3 (three) times daily as needed for cough.   Yes Historical Provider, MD  fluticasone (FLONASE) 50 MCG/ACT nasal spray Place 1 spray into both nostrils daily.   Yes Historical Provider, MD  ibuprofen (ADVIL,MOTRIN) 800 MG tablet Take 800 mg by mouth every 8 (eight) hours as needed.   Yes Historical Provider, MD  brompheniramine-pseudoephedrine-DM 30-2-10 MG/5ML syrup Take 5 mLs by mouth 4 (four) times daily as needed. 02/10/16   Sable Feil, PA-C  Diphenhyd-Hydrocort-Nystatin (FIRST-DUKES MOUTHWASH) SUSP Use as directed 10 mLs in the mouth or throat 4 (four) times daily. 02/10/16   Sable Feil, PA-C  MedroxyPROGESTERone Acetate 150 MG/ML SUSY INJ 1 SYRINGE IM Q 3 MONTHS 02/10/15   Historical Provider, MD    Allergies Patient has no known allergies.  Family History    Problem Relation Age of Onset  . Clotting disorder Mother   . Diabetes Mother   . Hypertension Mother   . Diabetes Father   . Hypertension Father     Social History Social History  Substance Use Topics  . Smoking status: Never Smoker  . Smokeless tobacco: Never Used  . Alcohol use No    Review of Systems Constitutional: Fever and chills. Body aches Eyes: No visual changes. ENT: Sore throat. Cardiovascular: Denies chest pain. Respiratory: Denies shortness of breath. Nonproductive cough Gastrointestinal: No abdominal pain.  No nausea, no vomiting.  No diarrhea.  No constipation. Genitourinary: Negative for dysuria. Musculoskeletal: Negative for back pain. Skin: Negative for rash. Neurological: Negative for headaches, focal weakness or numbness. Psychiatric:Anxiety ____________________________________________   PHYSICAL EXAM:  VITAL SIGNS: ED Triage Vitals  Enc Vitals Group     BP 02/10/16 0554 (!) 143/75     Pulse Rate 02/10/16 0554 (!) 126     Resp 02/10/16 0554 18     Temp 02/10/16 0554 100.2 F (37.9 C)     Temp Source 02/10/16 0554 Oral     SpO2 02/10/16 0554 98 %     Weight 02/10/16 0555 203 lb (92.1 kg)     Height 02/10/16 0555 5\' 7"  (1.702 m)     Head Circumference --      Peak Flow --      Pain Score 02/10/16 0555 9     Pain Loc --      Pain Edu? --      Excl. in [place]? --  Constitutional: Alert and oriented. Well appearing and in no acute distress. Eyes: Conjunctivae are normal. PERRL. EOMI. Head: Atraumatic. Nose: No congestion/rhinnorhea. Mouth/Throat: Mucous membranes are moist.  Oropharynx erythematous.Tonsils are not exudative. Neck: No stridor.  No cervical spine tenderness to palpation. Hematological/Lymphatic/Immunilogical: No cervical lymphadenopathy. Cardiovascular: Normal rate, regular rhythm. Grossly normal heart sounds.  Good peripheral circulation. Respiratory: Normal respiratory effort.  No retractions. Lungs  CTAB. Gastrointestinal: Soft and nontender. No distention. No abdominal bruits. No CVA tenderness. Musculoskeletal: No lower extremity tenderness nor edema.  No joint effusions. Neurologic:  Normal speech and language. No gross focal neurologic deficits are appreciated. No gait instability. Skin:  Skin is warm, dry and intact. No rash noted. Psychiatric: Mood and affect are normal. Speech and behavior are normal.  ____________________________________________   LABS (all labs ordered are listed, but only abnormal results are displayed)  Labs Reviewed  CULTURE, GROUP A STREP [facility])  POCT RAPID STREP A   ____________________________________________  EKG   ____________________________________________  RADIOLOGY   ____________________________________________   PROCEDURES  Procedure(s) performed: None  Procedures  Critical Care performed: No  ____________________________________________   INITIAL IMPRESSION / ASSESSMENT AND PLAN / ED COURSE  Pertinent labs & imaging results that were available during my care of the patient were reviewed by me and considered in my medical decision making (see chart for details).  Flulike illness. Patient advised to increase ibuprofen from 200 mg to 600 mg. Continue previous medications. Patient given a prescription for tomorrow morning. Patient advised follow-up with family doctor condition persists.  Clinical Course    Discussed with patient the rapid strep test negative culture is pending. Discusses a viral type illness antibiotics not be appropriate this time.  ____________________________________________   FINAL CLINICAL IMPRESSION(S) / ED DIAGNOSES  Final diagnoses:  Viral illness      NEW MEDICATIONS STARTED DURING THIS VISIT:  New Prescriptions   BROMPHENIRAMINE-PSEUDOEPHEDRINE-DM 30-2-10 MG/5ML SYRUP    Take 5 mLs by mouth 4 (four) times daily as needed.   DIPHENHYD-HYDROCORT-NYSTATIN (FIRST-DUKES MOUTHWASH) SUSP     Use as directed 10 mLs in the mouth or throat 4 (four) times daily.     Note:  This document was prepared using Dragon voice recognition software and may include unintentional dictation errors.    Sable Feil, PA-C 02/10/16 [id number]    Nance Pear, MD 02/10/16 [phone]

## 2016-02-10 NOTE — ED Triage Notes (Signed)
Patient ambulatory to triage with steady gait, without difficulty or distress noted; pt reports sore throat, cough last several days; seen at urgent care and flu negative; mask in place

## 2016-02-10 NOTE — ED Notes (Signed)
See triage note  Developed fever with sore throat and cough couple of days ago  Was seen at [facility] yesterday  Had negative flu swab  But states her throat is worse  Increased pain with swallowing  Febrile on arrival

## 2016-02-12 LAB — CULTURE, GROUP A STREP (THRC)

## 2016-02-29 ENCOUNTER — Encounter: Payer: Self-pay | Admitting: Emergency Medicine

## 2016-02-29 ENCOUNTER — Emergency Department
Admission: EM | Admit: 2016-02-29 | Discharge: 2016-02-29 | Disposition: A | Discharge status: Home / Self Care | Payer: Managed Care, Other (non HMO) | Attending: Emergency Medicine | Admitting: Emergency Medicine

## 2016-02-29 ENCOUNTER — Emergency Department: Payer: Managed Care, Other (non HMO)

## 2016-02-29 DIAGNOSIS — Y9389 Activity, other specified: Secondary | ICD-10-CM | POA: Insufficient documentation

## 2016-02-29 DIAGNOSIS — R55 Syncope and collapse: Secondary | ICD-10-CM | POA: Insufficient documentation

## 2016-02-29 DIAGNOSIS — S199XXA Unspecified injury of neck, initial encounter: Secondary | ICD-10-CM | POA: Diagnosis present

## 2016-02-29 DIAGNOSIS — Y92411 Interstate highway as the place of occurrence of the external cause: Secondary | ICD-10-CM | POA: Insufficient documentation

## 2016-02-29 DIAGNOSIS — Y999 Unspecified external cause status: Secondary | ICD-10-CM | POA: Diagnosis not present

## 2016-02-29 DIAGNOSIS — M62838 Other muscle spasm: Secondary | ICD-10-CM

## 2016-02-29 DIAGNOSIS — S161XXA Strain of muscle, fascia and tendon at neck level, initial encounter: Secondary | ICD-10-CM | POA: Insufficient documentation

## 2016-02-29 LAB — BASIC METABOLIC PANEL
ANION GAP: 5 (ref 5–15)
BUN: 7 mg/dL (ref 6–20)
CO2: 27 mmol/L (ref 22–32)
Calcium: 9.3 mg/dL (ref 8.9–10.3)
Chloride: 106 mmol/L (ref 101–111)
Creatinine, Ser: 0.67 mg/dL (ref 0.44–1.00)
GFR calc Af Amer: >60 mL/min (ref >60)
GFR calc non Af Amer: >60 mL/min (ref >60)
GLUCOSE: 107 mg/dL — AB (ref 65–99)
POTASSIUM: 3.7 mmol/L (ref 3.5–5.1)
Sodium: 138 mmol/L (ref 135–145)

## 2016-02-29 LAB — CBC
HEMATOCRIT: 39.5 % (ref 35.0–47.0)
HEMOGLOBIN: 13.4 g/dL (ref 12.0–16.0)
MCH: 26.5 pg (ref 26.0–34.0)
MCHC: 34 g/dL (ref 32.0–36.0)
MCV: 78 fL — ABNORMAL LOW (ref 80.0–100.0)
Platelets: 320 10*3/uL (ref 150–440)
RBC: 5.06 MIL/uL (ref 3.80–5.20)
RDW: 14.7 % — ABNORMAL HIGH (ref 11.5–14.5)
WBC: 6.4 10*3/uL (ref 3.6–11.0)

## 2016-02-29 MED ORDER — CYCLOBENZAPRINE HCL 10 MG PO TABS: 10.0000 mg | ORAL_TABLET | Freq: Three times a day (TID) | ORAL | 0 refills | Status: DC | PRN

## 2016-02-29 MED ORDER — KETOROLAC TROMETHAMINE 30 MG/ML IJ SOLN
30.0000 mg | Freq: Once | INTRAMUSCULAR | Status: AC
  Administered 2016-02-29: 30 mg via INTRAVENOUS
  Filled 2016-02-29: qty 1

## 2016-02-29 MED ORDER — NAPROXEN 500 MG PO TABS: 500.0000 mg | ORAL_TABLET | Freq: Two times a day (BID) | ORAL | 0 refills | Status: DC
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
## 2016-02-29 NOTE — ED Notes (Signed)
Pt informed provider that she became dizzy and had near syncopal episode  Also that she had episode of blurred vision  Denies blurred vision at present  Sitting on bed texting on phone
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
## 2017-04-17 ENCOUNTER — Inpatient Hospital Stay (HOSPITAL_COMMUNITY)
Admission: AD | Admit: 2017-04-17 | Discharge: 2017-04-17 | Disposition: A | Discharge status: Home / Self Care | Payer: Managed Care, Other (non HMO) | Source: Ambulatory Visit | Attending: Obstetrics and Gynecology | Admitting: Obstetrics and Gynecology

## 2017-04-17 ENCOUNTER — Encounter (HOSPITAL_COMMUNITY): Payer: Self-pay | Admitting: *Deleted

## 2017-04-17 DIAGNOSIS — N920 Excessive and frequent menstruation with regular cycle: Secondary | ICD-10-CM | POA: Insufficient documentation

## 2017-04-17 DIAGNOSIS — N939 Abnormal uterine and vaginal bleeding, unspecified: Secondary | ICD-10-CM | POA: Diagnosis present

## 2017-04-17 DIAGNOSIS — R109 Unspecified abdominal pain: Secondary | ICD-10-CM | POA: Diagnosis not present

## 2017-04-17 LAB — URINALYSIS, ROUTINE W REFLEX MICROSCOPIC
Bilirubin Urine: NEGATIVE
Glucose, UA: NEGATIVE mg/dL
Ketones, ur: NEGATIVE mg/dL
Leukocytes, UA: NEGATIVE
Nitrite: NEGATIVE
Protein, ur: 30 mg/dL — AB
Specific Gravity, Urine: 1.029 (ref 1.005–1.030)
pH: 7 (ref 5.0–8.0)

## 2017-04-17 LAB — CBC
HCT: 38.4 % (ref 36.0–46.0)
Hemoglobin: 12.9 g/dL (ref 12.0–15.0)
MCH: 26.7 pg (ref 26.0–34.0)
MCHC: 33.6 g/dL (ref 30.0–36.0)
MCV: 79.3 fL (ref 78.0–100.0)
Platelets: 309 10*3/uL (ref 150–400)
RBC: 4.84 MIL/uL (ref 3.87–5.11)
RDW: 14.3 % (ref 11.5–15.5)
WBC: 5.9 10*3/uL (ref 4.0–10.5)

## 2017-04-17 LAB — WET PREP, GENITAL
Clue Cells Wet Prep HPF POC: NONE SEEN
Sperm: NONE SEEN
Trich, Wet Prep: NONE SEEN
Yeast Wet Prep HPF POC: NONE SEEN

## 2017-04-17 LAB — POCT PREGNANCY, URINE: Preg Test, Ur: NEGATIVE

## 2017-04-17 NOTE — MAU Note (Signed)
Pt reports she called her ob/gyn today with report of heavy bleeding, cramping, lots of clots. Symptoms started today. Changing pad q one hour.

## 2017-04-17 NOTE — Discharge Instructions (Signed)

## 2017-04-17 NOTE — MAU Provider Note (Signed)
Chief Complaint: Abdominal Pain and Vaginal Bleeding   First Provider Initiated Contact with Patient 04/17/17 1956      SUBJECTIVE HPI: Kelli Cobb is a 27 y.o. G0P0000 not currently pregnant who presents to maternity admissions reporting abdominal pain and vaginal bleeding. She reports getting off of Depo last year and this being her second period since stopping Depo injections. She reports recently finding out three weeks ago that she has fibroids. She reports starting her cycle on 3/4 and reports heavier bleeding that usual with associated symptoms of cramping. She reports changing pads every hour today. She denies vaginal itching/burning, urinary symptoms, h/a, dizziness, n/v, or fever/chills.    Past Medical History:  Diagnosis Date  . Anxiety   . Syncope    Past Surgical History:  Procedure Laterality Date  . NO PAST SURGERIES     Social History   Socioeconomic History  . Marital status: Single    Spouse name: Not on file  . Number of children: Not on file  . Years of education: Not on file  . Highest education level: Not on file  Social Needs  . Financial resource strain: Not on file  . Food insecurity - worry: Not on file  . Food insecurity - inability: Not on file  . Transportation needs - medical: Not on file  . Transportation needs - non-medical: Not on file  Occupational History  . Not on file  Tobacco Use  . Smoking status: Never Smoker  . Smokeless tobacco: Never Used  Substance and Sexual Activity  . Alcohol use: Yes    Alcohol/week: 0.0 oz    Comment: occ  . Drug use: No  . Sexual activity: Yes    Birth control/protection: Condom  Other Topics Concern  . Not on file  Social History Narrative  . Not on file   No current facility-administered medications on file prior to encounter.    Current Outpatient Medications on File Prior to Encounter  Medication Sig Dispense Refill  . acetaminophen (TYLENOL) 500 MG tablet Take 1,000 mg by mouth every 6 (six)  hours as needed.    . cyclobenzaprine (FLEXERIL) 10 MG tablet Take 1 tablet (10 mg total) by mouth 3 (three) times daily as needed for muscle spasms. 21 tablet 0  . Multiple Vitamin (MULTIVITAMIN) tablet Take 1 tablet by mouth daily.    . MedroxyPROGESTERone Acetate 150 MG/ML SUSY INJ 1 SYRINGE IM Q 3 MONTHS  2  . naproxen (NAPROSYN) 500 MG tablet Take 1 tablet (500 mg total) by mouth 2 (two) times daily with a meal. 14 tablet 0   No Known Allergies  ROS:  Review of Systems  Constitutional: Negative.   Respiratory: Negative.   Cardiovascular: Negative.   Gastrointestinal: Positive for abdominal pain. Negative for constipation, diarrhea, nausea and vomiting.  Genitourinary: Positive for vaginal bleeding. Negative for difficulty urinating, dysuria, frequency, pelvic pain, urgency and vaginal pain.       Clots with vaginal bleeding  Musculoskeletal: Negative.   Neurological: Negative.   Psychiatric/Behavioral: Negative.    I have reviewed patient's Past Medical Hx, Surgical Hx, Family Hx, Social Hx, medications and allergies.   Physical Exam   Patient Vitals for the past 24 hrs:  BP Temp Temp src Pulse Resp SpO2 Height Weight  04/17/17 2116 (!) 143/79 - - - - - - -  04/17/17 2114 (!) 147/84 98.8 F (37.1 C) Oral 84 18 - - -  04/17/17 1852 135/74 98.5 F (36.9 C) Oral 82 16 100 %  5\' 7"  (1.702 m) 212 lb (96.2 kg)   Constitutional: Well-developed, well-nourished female in no acute distress.  Cardiovascular: normal rate Respiratory: normal effort GI: Abd soft, non-tender. Pos BS x 4 MS: Extremities nontender, no edema, normal ROM Neurologic: Alert and oriented x 4.  GU: Neg CVAT.  PELVIC EXAM: Cervix pink, visually closed, without lesion, moderate dark red vaginal bleeding- no clots present in vagina, vaginal walls and external genitalia normal Bimanual exam: Cervix 0/long/high, firm, anterior, neg CMT, uterus nontender, nonenlarged, adnexa without tenderness, enlargement, or  mass  LAB RESULTS Results for orders placed or performed during the hospital encounter of 04/17/17 (from the past 24 hour(s))  Urinalysis, Routine w reflex microscopic     Status: Abnormal   Collection Time: 04/17/17  6:53 PM  Result Value Ref Range   Color, Urine YELLOW YELLOW   APPearance CLOUDY (A) CLEAR   Specific Gravity, Urine 1.029 1.005 - 1.030   pH 7.0 5.0 - 8.0   Glucose, UA NEGATIVE NEGATIVE mg/dL   Hgb urine dipstick LARGE (A) NEGATIVE   Bilirubin Urine NEGATIVE NEGATIVE   Ketones, ur NEGATIVE NEGATIVE mg/dL   Protein, ur 30 (A) NEGATIVE mg/dL   Nitrite NEGATIVE NEGATIVE   Leukocytes, UA NEGATIVE NEGATIVE   RBC / HPF TOO NUMEROUS TO COUNT 0 - 5 RBC/hpf   WBC, UA 6-30 0 - 5 WBC/hpf   Bacteria, UA RARE (A) NONE SEEN   Squamous Epithelial / LPF 0-5 (A) NONE SEEN   Mucus PRESENT   Pregnancy, urine POC     Status: None   Collection Time: 04/17/17  7:11 PM  Result Value Ref Range   Preg Test, Ur NEGATIVE NEGATIVE  CBC     Status: None   Collection Time: 04/17/17  7:53 PM  Result Value Ref Range   WBC 5.9 4.0 - 10.5 K/uL   RBC 4.84 3.87 - 5.11 MIL/uL   Hemoglobin 12.9 12.0 - 15.0 g/dL   HCT 38.4 36.0 - 46.0 %   MCV 79.3 78.0 - 100.0 fL   MCH 26.7 26.0 - 34.0 pg   MCHC 33.6 30.0 - 36.0 g/dL   RDW 14.3 11.5 - 15.5 %   Platelets 309 150 - 400 K/uL  Wet prep, genital     Status: Abnormal   Collection Time: 04/17/17  8:07 PM  Result Value Ref Range   Yeast Wet Prep HPF POC NONE SEEN NONE SEEN   Trich, Wet Prep NONE SEEN NONE SEEN   Clue Cells Wet Prep HPF POC NONE SEEN NONE SEEN   WBC, Wet Prep HPF POC FEW (A) NONE SEEN   Sperm NONE SEEN     MAU Management/MDM: Orders Placed This Encounter  Procedures  . Wet prep, genital  . Urinalysis, Routine w reflex microscopic  . CBC  . Pregnancy, urine POC  . Discharge patient Discharge disposition: 01-Home or Self Care; Discharge patient date: 04/17/2017  Wet prep- neg GC/C- pending CBC- WNL, hgb 12.9- not anemic   UPT-negative   Consult Dr Philis Pique with assessment and exam finding- recommends discharge home with normal labs and decreased vaginal bleeding, follow up in office if bleeding increases for hormonal treatment.     Pt discharged with bleeding precautions and education on DUB.   ASSESSMENT 1. Menorrhagia with regular cycle   2. Abdominal cramping     PLAN Discharge home. Pt stable at time of discharge. Follow up in OBGYN office for worsening symptoms    Follow-up Information    Ob/Gyn, Ny[facility] Cowden  [place] Follow up.   Why:  Follow up as scheduled for annual visits. Call the office with heavy menstrual cycle.  Contact information: [place] [address] [phone]           Allergies as of 04/17/2017   No Known Allergies     Medication List    STOP taking these medications   medroxyPROGESTERone Acetate 150 MG/ML [place]     TAKE these medications   acetaminophen 500 MG tablet Commonly known as:  TYLENOL Take 1,000 mg by mouth every 6 (six) hours as needed.   cyclobenzaprine 10 MG tablet Commonly known as:  FLEXERIL Take 1 tablet (10 mg total) by mouth 3 (three) times daily as needed for muscle spasms.   multivitamin tablet Take 1 tablet by mouth daily.   naproxen 500 MG tablet Commonly known as:  NAPROSYN Take 1 tablet (500 mg total) by mouth 2 (two) times daily with a meal.      Darrol Poke  Certified Nurse-Midwife 04/17/2017  10:02 PM

## 2017-04-18 LAB — GC/CHLAMYDIA PROBE AMP (~~LOC~~) NOT AT ARMC
Chlamydia: NEGATIVE
Neisseria Gonorrhea: NEGATIVE

## 2017-08-26 ENCOUNTER — Encounter: Payer: Self-pay | Admitting: *Deleted

## 2017-08-26 ENCOUNTER — Other Ambulatory Visit: Payer: Self-pay

## 2017-08-26 ENCOUNTER — Emergency Department
Admission: EM | Admit: 2017-08-26 | Discharge: 2017-08-26 | Disposition: A | Discharge status: Home / Self Care | Payer: Managed Care, Other (non HMO) | Attending: Emergency Medicine | Admitting: Emergency Medicine

## 2017-08-26 ENCOUNTER — Emergency Department: Payer: Managed Care, Other (non HMO)

## 2017-08-26 DIAGNOSIS — M549 Dorsalgia, unspecified: Secondary | ICD-10-CM | POA: Diagnosis not present

## 2017-08-26 DIAGNOSIS — R0789 Other chest pain: Secondary | ICD-10-CM | POA: Diagnosis present

## 2017-08-26 DIAGNOSIS — M7918 Myalgia, other site: Secondary | ICD-10-CM | POA: Diagnosis not present

## 2017-08-26 DIAGNOSIS — Z79899 Other long term (current) drug therapy: Secondary | ICD-10-CM | POA: Diagnosis not present

## 2017-08-26 LAB — CBC
HCT: 40.5 % (ref 35.0–47.0)
Hemoglobin: 13.6 g/dL (ref 12.0–16.0)
MCH: 26.7 pg (ref 26.0–34.0)
MCHC: 33.6 g/dL (ref 32.0–36.0)
MCV: 79.5 fL — AB (ref 80.0–100.0)
Platelets: 337 10*3/uL (ref 150–440)
RBC: 5.1 MIL/uL (ref 3.80–5.20)
RDW: 14.2 % (ref 11.5–14.5)
WBC: 5.5 10*3/uL (ref 3.6–11.0)

## 2017-08-26 LAB — BASIC METABOLIC PANEL
Anion gap: 5 (ref 5–15)
BUN: 12 mg/dL (ref 6–20)
CALCIUM: 9.2 mg/dL (ref 8.9–10.3)
CHLORIDE: 107 mmol/L (ref 98–111)
CO2: 28 mmol/L (ref 22–32)
CREATININE: 0.67 mg/dL (ref 0.44–1.00)
GFR calc non Af Amer: >60 mL/min (ref >60)
Glucose, Bld: 104 mg/dL — ABNORMAL HIGH (ref 70–99)
Potassium: 4.1 mmol/L (ref 3.5–5.1)
SODIUM: 140 mmol/L (ref 135–145)

## 2017-08-26 LAB — TROPONIN I

## 2017-08-26 LAB — FIBRIN DERIVATIVES D-DIMER (ARMC ONLY): Fibrin derivatives D-dimer (ARMC): 325.99 ng/mL (FEU) (ref 0.00–499.00)

## 2017-08-26 MED ORDER — KETOROLAC TROMETHAMINE 30 MG/ML IJ SOLN
15.0000 mg | Freq: Once | INTRAMUSCULAR | Status: AC
  Administered 2017-08-26: 15 mg via INTRAVENOUS
  Filled 2017-08-26: qty 1

## 2017-08-26 NOTE — Discharge Instructions (Signed)

## 2017-08-26 NOTE — ED Notes (Signed)
Pt reports pain in upper back on the left side with shortness of breath that started earlier today - pt reports that she went to urgent care and they sent her to the ED for eval - pt appears in no acute distress at this time - she is laughing with significant other and eating a meal from cook out - respirations are even and unlabored with no use of accessory muscles

## 2017-08-26 NOTE — ED Provider Notes (Signed)
[facility] Emergency Department Provider Note  ____________________________________________   First MD Initiated Contact with Patient 08/26/17 1811     (approximate)  I have reviewed the triage vital signs and the nursing notes.   HISTORY  Chief Complaint Chest Pain and Cough    HPI Kelli Cobb is a 27 y.o. female with no chronic medical issues except anxiety who presents for evaluation of acute onset left scapular pain radiating through to her chest.  This occurred acutely while she was at [facility]. [place] earlier today.  Bending over and reaching down with her left side reproduces the pain but bending over reaching out with her right side does not.  There is some tenderness to palpation and movement but it is difficult to reproduce exactly what movements reproduce the pain.  The pain was severe and sharp and stabbing and took away her breath but she was not otherwise short of breath.  She denies any recent illnesses.  She denies fever/chills, nausea, vomiting, and abdominal pain.  She does not think that she pulled anything or has any recent traumatic injuries.  Nothing in particular made the symptoms better but she does feel better now that she still feels some discomfort from time to time.  She went to urgent care and was recommended to come to the emergency department and is waited about 3 and half hours for a bed.  In the meantime she has been able to eat a meal of cookout and says that eating does not reproduce her pain or discomfort.  She has no personal history of blood clots in the legs of the lungs but her mother reportedly had numerous blood clots in her lungs in her abdomen and was on anticoagulation.  The patient has never had genetic testing.  She has been on birth control up until about a month ago.  She has had no unilateral leg swelling or pain.  No recent surgeries or immobilizations or long trips.  She is a non-smoker.  Past Medical History:  Diagnosis  Date  . Anxiety   . Syncope     Patient Active Problem List   Diagnosis Date Noted  . Chest pain 01/08/2016    Past Surgical History:  Procedure Laterality Date  . NO PAST SURGERIES      Prior to Admission medications   Medication Sig Start Date End Date Taking? Authorizing Provider  acetaminophen (TYLENOL) 500 MG tablet Take 1,000 mg by mouth every 6 (six) hours as needed for mild pain.    Yes [provider]  ESTARYLLA 0.25-35 MG-MCG tablet Take 1 tablet by mouth daily. 06/26/17  Yes [provider]  Multiple Vitamin (MULTIVITAMIN) tablet Take 1 tablet by mouth daily.   Yes [provider]  cyclobenzaprine (FLEXERIL) 10 MG tablet Take 1 tablet (10 mg total) by mouth 3 (three) times daily as needed for muscle spasms. Patient not taking: Reported on 08/26/2017 02/29/16   Hagler, Jami L, PA-C  naproxen (NAPROSYN) 500 MG tablet Take 1 tablet (500 mg total) by mouth 2 (two) times daily with a meal. Patient not taking: Reported on 08/26/2017 02/29/16   Hagler, Jami L, PA-C    Allergies Patient has no known allergies.  Family History  Problem Relation Age of Onset  . Clotting disorder Mother   . Diabetes Mother   . Hypertension Mother   . Diabetes Father   . Hypertension Father     Social History Social History   Tobacco Use  . Smoking  status: Never Smoker  . Smokeless tobacco: Never Used  Substance Use Topics  . Alcohol use: Yes    Alcohol/week: 0.0 oz    Comment: occ  . Drug use: No    Review of Systems Constitutional: No fever/chills Eyes: No visual changes. ENT: No sore throat. Cardiovascular: Chest pain as described above Respiratory: Denies shortness of breath. Gastrointestinal: No abdominal pain.  No nausea, no vomiting.  No diarrhea.  No constipation. Genitourinary: Negative for dysuria. Musculoskeletal: Negative for neck pain.  Left scapular pain as described above radiating through to the chest. Integumentary: Negative for  rash. Neurological: Negative for headaches, focal weakness or numbness.   ____________________________________________   PHYSICAL EXAM:  VITAL SIGNS: ED Triage Vitals  Enc Vitals Group     BP --      Pulse Rate 08/26/17 1511 91     Resp 08/26/17 1511 16     Temp 08/26/17 1511 98.3 F (36.8 C)     Temp Source 08/26/17 1511 Oral     SpO2 08/26/17 1511 100 %     Weight 08/26/17 1503 94.8 kg (209 lb)     Height 08/26/17 1503 1.702 m (5\' 7" )     Head Circumference --      Peak Flow --      Pain Score 08/26/17 1503 10     Pain Loc --      Pain Edu? --      Excl. in [place]? --     Constitutional: Alert and oriented. Well appearing and in no acute distress. Eyes: Conjunctivae are normal.  Head: Atraumatic. Nose: No congestion/rhinnorhea. Mouth/Throat: Mucous membranes are moist. Neck: No stridor.  No meningeal signs.   Cardiovascular: Normal rate, regular rhythm. Good peripheral circulation. Grossly normal heart sounds.  No reproducible chest wall tenderness Respiratory: Normal respiratory effort.  No retractions. Lungs CTAB. Gastrointestinal: Soft and nontender. No distention.  Musculoskeletal: No lower extremity tenderness nor edema. No gross deformities of extremities.  No reproducible tenderness to palpation or range of motion of the left arm/shoulder Neurologic:  Normal speech and language. No gross focal neurologic deficits are appreciated.  Skin:  Skin is warm, dry and intact. No rash noted. Psychiatric: Mood and affect are normal. Speech and behavior are normal.  ____________________________________________   LABS (all labs ordered are listed, but only abnormal results are displayed)  Labs Reviewed  BASIC METABOLIC PANEL - Abnormal; Notable for the following components:      Result Value   Glucose, Bld 104 (*)    All other components within normal limits  CBC - Abnormal; Notable for the following components:   MCV 79.5 (*)    All other components within normal  limits  TROPONIN I  FIBRIN DERIVATIVES D-DIMER ([facility] ONLY)  TROPONIN I  POC URINE PREG, ED   ____________________________________________  EKG  ED ECG REPORT I, Hinda Kehr, the attending physician, personally viewed and interpreted this ECG.  Date: 08/26/2017 EKG Time: 15: 06 Rate: 90 Rhythm: normal sinus rhythm QRS Axis: normal Intervals: normal ST/T Wave abnormalities: normal Narrative Interpretation: no evidence of acute ischemia  ____________________________________________  RADIOLOGY I, Hinda Kehr, personally viewed and evaluated these images (plain radiographs) as part of my medical decision making, as well as reviewing the written report by the radiologist.  ED MD interpretation: No indication of acute abnormality on chest x-ray  Official radiology report(s): Dg Chest 2 View  Result Date: 08/26/2017 CLINICAL DATA:  reports of centralized chest pain x 2 hours. Pain radiates to  pts back and down left arm. No SOB, dizziness, lightheadedness, NVD. pT has had a cough and congestion for the past week EXAM: CHEST - 2 VIEW COMPARISON:  01/08/2016 FINDINGS: The heart size and mediastinal contours are within normal limits. Both lungs are clear. No pleural effusion or pneumothorax. The visualized skeletal structures are unremarkable. IMPRESSION: Normal chest radiographs. Electronically Signed   By: Lajean Manes M.D.   On: 08/26/2017 15:54    ____________________________________________   PROCEDURES  Critical Care performed: No   Procedure(s) performed:   Procedures   ____________________________________________   INITIAL IMPRESSION / ASSESSMENT AND PLAN / ED COURSE  As part of my medical decision making, I reviewed the following data within the [place] History obtained from family, Nursing notes reviewed and incorporated, Labs reviewed , EKG interpreted  and Radiograph reviewed     Differential diagnosis includes, but is not limited to,  musculoskeletal chest wall pain, PE, ACS, aortic dissection, cardiac tamponade, pneumothorax, pneumonia, pericarditis, myocarditis, GI-related causes including esophagitis/gastritis or biliary colic.  Fortunately the patient is well-appearing in no acute distress.  Vital signs are normal and she is not tachycardic.  She is laughing and joking and eating with no reproduction of the pain or tenderness.  Her CBC is normal and her troponin is negative and her basic metabolic panel is within normal limits.  I would rule her out from PE with the [facility] rule, but I feel that the fact that her mother had numerous blood clots of unidentified reason increases her pretest probability enough that it bears further investigation.  It may be completely unrelated and not genetic, but it at least bears rule out with d-dimer.  I had my usual customary d-dimer risks/benefits discussion with the patient and her husband and they understand and agree with the plan to proceed with d-dimer, and if it is within normal limits, our evaluation is finished.  She is low risk for ACS based on HEART score and her EKG and chest x-ray are both reassuring as well.  If the d-dimer is elevated we will proceed with CTA chest to rule out pulmonary embolism.   Clinical Course as of Aug 26 2104  Nancy Fetter Aug 26, 2017  2105 (Note that documentation was delayed due to multiple ED patients requiring immediate care.)  The patient's d-dimer is within normal limits.  She continues to have pain that seems more and more musculoskeletal as it is reproduced primarily when she moves around.  I gave her a dose of Toradol 15 mg IV and encouraged over-the-counter alternating doses of ibuprofen and Tylenol according to label instructions.  She will follow-up as an outpatient.  I gave my usual and customary return precautions.  No indication for repeat troponin based on very low risk of ACS and HEART score.   [CF]    Clinical Course User Index [CF] Hinda Kehr,  MD    ____________________________________________  FINAL CLINICAL IMPRESSION(S) / ED DIAGNOSES  Final diagnoses:  Acute back pain, unspecified back location, unspecified back pain laterality  Atypical chest pain  Musculoskeletal pain     MEDICATIONS GIVEN DURING THIS VISIT:  Medications  ketorolac (TORADOL) 30 MG/ML injection 15 mg (15 mg Intravenous Given 08/26/17 1946)     ED Discharge Orders    None       Note:  This document was prepared using Dragon voice recognition software and may include unintentional dictation errors.    Hinda Kehr, MD 08/26/17 2106

## 2017-08-26 NOTE — ED Triage Notes (Signed)
Pt to ED from UC with reports of centralized chest pain x 2 hours. Pain radiates to pts back and down left arm. No SOB, dizziness, lightheadedness, NVD. pT has had a cough and congestion for the past week. No chest tenderness upon palpation. No cardiac hx.

## 2017-08-26 NOTE — ED Notes (Signed)
Pt able to tolerate a full meal while in triage.

## 2018-05-03 ENCOUNTER — Emergency Department (HOSPITAL_COMMUNITY): Payer: Managed Care, Other (non HMO)

## 2018-05-03 ENCOUNTER — Emergency Department (HOSPITAL_COMMUNITY)
Admission: EM | Admit: 2018-05-03 | Discharge: 2018-05-03 | Disposition: A | Discharge status: Home / Self Care | Payer: Managed Care, Other (non HMO) | Attending: Physician Assistant | Admitting: Physician Assistant

## 2018-05-03 ENCOUNTER — Encounter (HOSPITAL_COMMUNITY): Payer: Self-pay | Admitting: Emergency Medicine

## 2018-05-03 ENCOUNTER — Other Ambulatory Visit: Payer: Self-pay

## 2018-05-03 DIAGNOSIS — R05 Cough: Secondary | ICD-10-CM | POA: Diagnosis not present

## 2018-05-03 DIAGNOSIS — R0602 Shortness of breath: Secondary | ICD-10-CM | POA: Diagnosis present

## 2018-05-03 DIAGNOSIS — R0789 Other chest pain: Secondary | ICD-10-CM | POA: Insufficient documentation

## 2018-05-03 DIAGNOSIS — Z79899 Other long term (current) drug therapy: Secondary | ICD-10-CM | POA: Diagnosis not present

## 2018-05-03 DIAGNOSIS — R0982 Postnasal drip: Secondary | ICD-10-CM | POA: Diagnosis not present

## 2018-05-03 DIAGNOSIS — R059 Cough, unspecified: Secondary | ICD-10-CM

## 2018-05-03 LAB — CBC WITH DIFFERENTIAL/PLATELET
ABS IMMATURE GRANULOCYTES: 0.01 10*3/uL (ref 0.00–0.07)
BASOS ABS: 0 10*3/uL (ref 0.0–0.1)
Basophils Relative: 1 %
Eosinophils Absolute: 0 10*3/uL (ref 0.0–0.5)
Eosinophils Relative: 1 %
HEMATOCRIT: 41.7 % (ref 36.0–46.0)
Hemoglobin: 13.1 g/dL (ref 12.0–15.0)
IMMATURE GRANULOCYTES: 0 %
LYMPHS PCT: 45 %
Lymphs Abs: 1.7 10*3/uL (ref 0.7–4.0)
MCH: 26.4 pg (ref 26.0–34.0)
MCHC: 31.4 g/dL (ref 30.0–36.0)
MCV: 84.1 fL (ref 80.0–100.0)
MONOS PCT: 8 %
Monocytes Absolute: 0.3 10*3/uL (ref 0.1–1.0)
NEUTROS ABS: 1.7 10*3/uL (ref 1.7–7.7)
NEUTROS PCT: 45 %
NRBC: 0 % (ref 0.0–0.2)
PLATELETS: 358 10*3/uL (ref 150–400)
RBC: 4.96 MIL/uL (ref 3.87–5.11)
RDW: 14.1 % (ref 11.5–15.5)
WBC: 3.7 10*3/uL — ABNORMAL LOW (ref 4.0–10.5)

## 2018-05-03 LAB — BASIC METABOLIC PANEL
ANION GAP: 7 (ref 5–15)
BUN: 5 mg/dL — ABNORMAL LOW (ref 6–20)
CALCIUM: 9 mg/dL (ref 8.9–10.3)
CO2: 26 mmol/L (ref 22–32)
Chloride: 106 mmol/L (ref 98–111)
Creatinine, Ser: 0.64 mg/dL (ref 0.44–1.00)
GLUCOSE: 88 mg/dL (ref 70–99)
POTASSIUM: 3.7 mmol/L (ref 3.5–5.1)
Sodium: 139 mmol/L (ref 135–145)

## 2018-05-03 LAB — BRAIN NATRIURETIC PEPTIDE: B Natriuretic Peptide: 9.3 pg/mL (ref 0.0–100.0)

## 2018-05-03 LAB — D-DIMER, QUANTITATIVE: D-Dimer, Quant: 0.34 ug/mL-FEU (ref 0.00–0.50)

## 2018-05-03 LAB — I-STAT TROPONIN, ED: Troponin i, poc: 0 ng/mL (ref 0.00–0.08)

## 2018-05-03 LAB — I-STAT BETA HCG BLOOD, ED (MC, WL, AP ONLY)

## 2018-05-03 MED ORDER — AEROCHAMBER PLUS FLO-VU MEDIUM MISC
1.0000 | Freq: Once | Status: AC
  Administered 2018-05-03: 1
  Filled 2018-05-03: qty 1

## 2018-05-03 MED ORDER — IPRATROPIUM-ALBUTEROL 0.5-2.5 (3) MG/3ML IN SOLN
3.0000 mL | Freq: Once | RESPIRATORY_TRACT | Status: AC
  Administered 2018-05-03: 3 mL via RESPIRATORY_TRACT
  Filled 2018-05-03: qty 3

## 2018-05-03 MED ORDER — ALBUTEROL SULFATE HFA 108 (90 BASE) MCG/ACT IN AERS
2.0000 | INHALATION_SPRAY | Freq: Once | RESPIRATORY_TRACT | Status: AC
  Administered 2018-05-03: 2 via RESPIRATORY_TRACT
  Filled 2018-05-03: qty 6.7

## 2018-05-03 MED ORDER — LIDOCAINE VISCOUS HCL 2 % MT SOLN
15.0000 mL | Freq: Once | OROMUCOSAL | Status: AC
  Administered 2018-05-03: 15 mL via ORAL
  Filled 2018-05-03: qty 15

## 2018-05-03 MED ORDER — ALBUTEROL SULFATE (2.5 MG/3ML) 0.083% IN NEBU
5.0000 mg | INHALATION_SOLUTION | Freq: Once | RESPIRATORY_TRACT | Status: DC
  Filled 2018-05-03: qty 6

## 2018-05-03 MED ORDER — ALUM & MAG HYDROXIDE-SIMETH 200-200-20 MG/5ML PO SUSP
30.0000 mL | Freq: Once | ORAL | Status: AC
  Administered 2018-05-03: 30 mL via ORAL
  Filled 2018-05-03: qty 30

## 2018-05-03 NOTE — Discharge Instructions (Addendum)
Today you were given an albuterol inhaler.  To use it you may use 2 puffs every 4 hours as needed.  Please schedule a follow-up appointment with your primary care doctor.  As we discussed your blood work today is reassuring, your chest x-ray does not show any abnormalities.  Your oxygen has been normal while you are here, your vitals do not significantly change when you lay down and sit up and stand up.  I suspect that your chest feels tight from musculoskeletal pain also called costochondritis related to your coughing.  Please start using Allegra, Claritin, or Zyrtec.  You may also take 1 to 2 pills of Benadryl at night.  Do not take Benadryl and then drive as Benadryl will make you very sleepy.  As you also got some relief from the heartburn medicine you may consider starting Prilosec or another over-the-counter heartburn medicine.  Please take Ibuprofen (Advil, motrin) and Tylenol (acetaminophen) to relieve your pain.  You may take up to 600 MG (3 pills) of normal strength ibuprofen every 8 hours as needed.  In between doses of ibuprofen you make take tylenol, up to 1,000 mg (two extra strength pills).  Do not take more than 3,000 mg tylenol in a 24 hour period.  Please check all medication labels as many medications such as pain and cold medications may contain tylenol.  Do not drink alcohol while taking these medications.  Do not take other NSAID'S while taking ibuprofen (such as aleve or naproxen).  Please take ibuprofen with food to decrease stomach upset.

## 2018-05-03 NOTE — ED Triage Notes (Addendum)
Pt was seen by PCP twice this week. Original concern was sinus drainage. Pt was treated for seasonal allergies and instructed to return for follow up if no change. Pt seen today with c/ shortness of breath and tightness in lungs. Pt reports dry cough and sinus drainage. Denis fever. Pt remains at 99% saturation on room air, during interview Pt is an employee at [employer]

## 2018-05-03 NOTE — ED Provider Notes (Signed)
[facility] DEPT Provider Note   CSN: [id number] Arrival date & time: 05/03/18  1253    History   Chief Complaint Chief Complaint  Patient presents with  . Shortness of Breath  . Cough    dry    HPI Kelli Cobb is a 28 y.o. female with a past medical history of anxiety, admitted 01/08/2016 for a chest pain rule out that was negative, who presents today for evaluation of chest pain.  She was reportedly having seasonal allergy type symptoms with nasal congestion, postnasal drainage and associated cough over the past week.  She went to her PCP for this, they started her on a nasal steroid spray and allergy medicine and she went back today for a recheck.  She has had chest tightness and shortness of breath over the past 2 days.  She reportedly had a breathing treatment at her PCPs office which made her chest tightness worse and after when she ambulated reportedly dropped her sat into the 70s.  She says that her mother has had multiple PEs and DVTs in the past however she has never personally had any.  Chart review shows that she has previously been admitted for cardiac rule out, she does not have a cardiac history.     HPI  Past Medical History:  Diagnosis Date  . Anxiety   . Syncope     Patient Active Problem List   Diagnosis Date Noted  . Chest pain 01/08/2016    Past Surgical History:  Procedure Laterality Date  . NO PAST SURGERIES       OB History    Gravida  0   Para  0   Term  0   Preterm  0   AB  0   Living  0     SAB  0   TAB  0   Ectopic  0   Multiple  0   Live Births  0            Home Medications    Prior to Admission medications   Medication Sig Start Date End Date Taking? Authorizing Provider  mometasone (NASONEX) 50 MCG/ACT nasal spray Place 2 sprays into the nose 2 (two) times daily. 04/30/18  Yes [provider]  Multiple Vitamin (MULTIVITAMIN) tablet Take 1 tablet by mouth daily.   Yes  [provider]  OZEMPIC, 1 MG/DOSE, 2 MG/1.5ML SOPN Inject 1 mg into the skin once a week. 04/15/18  Yes [provider]    Family History Family History  Problem Relation Age of Onset  . Clotting disorder Mother   . Hypertension Mother     Social History Social History   Tobacco Use  . Smoking status: Never Smoker  . Smokeless tobacco: Never Used  Substance Use Topics  . Alcohol use: Yes    Alcohol/week: 0.0 standard drinks    Comment: occ  . Drug use: No     Allergies   Patient has no known allergies.   Review of Systems Review of Systems  Constitutional: Negative for chills, fatigue and fever.  HENT: Positive for congestion and postnasal drip. Negative for ear discharge, ear pain, sinus pressure, sinus pain, sore throat and trouble swallowing.   Respiratory: Positive for cough, chest tightness and shortness of breath.   Cardiovascular: Positive for chest pain. Negative for palpitations and leg swelling.  Gastrointestinal: Negative for abdominal pain, constipation, diarrhea, nausea and vomiting.  Genitourinary: Negative for dysuria.  Neurological: Negative for weakness and  headaches.  All other systems reviewed and are negative.    Physical Exam Updated Vital Signs BP 113/74   Pulse (!) 108   Temp 98.9 F (37.2 C) (Oral)   Resp 17   Ht 5\' 7"  (1.702 m)   Wt 82.1 kg   LMP 04/04/2018 (Exact Date)   SpO2 100%   BMI 28.35 kg/m   Physical Exam Vitals signs and nursing note reviewed.  Constitutional:      General: She is not in acute distress.    Appearance: She is well-developed.     Comments: Eating food, speaking in full sentences with no obvious respiratory distress.  HENT:     Head: Normocephalic and atraumatic.  Eyes:     Conjunctiva/sclera: Conjunctivae normal.  Neck:     Musculoskeletal: Neck supple.  Cardiovascular:     Rate and Rhythm: Normal rate and regular rhythm.     Heart sounds: No murmur.     Comments: Heart rate  shows significant amount of variability with movement in bed. Pulmonary:     Effort: Pulmonary effort is normal. No respiratory distress.     Breath sounds: Normal breath sounds. No decreased breath sounds, wheezing, rhonchi or rales.  Chest:     Chest wall: Tenderness (She is diffusely tender to palpation over her anterior chest, worse over sternocostal joints. ) present. No deformity.  Abdominal:     General: Bowel sounds are normal.     Palpations: Abdomen is soft. There is no mass.     Tenderness: There is no abdominal tenderness.  Musculoskeletal: Normal range of motion.     Right lower leg: She exhibits no tenderness. No edema.     Left lower leg: She exhibits no tenderness. No edema.  Skin:    General: Skin is warm and dry.  Neurological:     General: No focal deficit present.     Mental Status: She is alert and oriented to person, place, and time.  Psychiatric:        Mood and Affect: Mood normal.        Behavior: Behavior normal.      ED Treatments / Results  Labs (all labs ordered are listed, but only abnormal results are displayed) Labs Reviewed  CBC WITH DIFFERENTIAL/PLATELET - Abnormal; Notable for the following components:      Result Value   WBC 3.7 (*)    All other components within normal limits  BASIC METABOLIC PANEL - Abnormal; Notable for the following components:   BUN 5 (*)    All other components within normal limits  D-DIMER, QUANTITATIVE (NOT AT [facility])  BRAIN NATRIURETIC PEPTIDE  I-STAT TROPONIN, ED  I-STAT BETA HCG BLOOD, ED ([facility], [facility], [facility] ONLY)    EKG EKG Interpretation  Date/Time:  Friday May 03 2018 13:02:59 EDT Ventricular Rate:  103 PR Interval:    QRS Duration: 88 QT Interval:  328 QTC Calculation: 430 R Axis:   70 Text Interpretation:  Sinus tachycardia Atrial premature complex Confirmed by Quintella Reichert [phone]) on 05/03/2018 4:16:38 PM   EKG Interpretation  Date/Time:  Friday May 03 2018 17:35:35 EDT Ventricular Rate:  97 PR  Interval:    QRS Duration: 80 QT Interval:  355 QTC Calculation: 451 R Axis:   56 Text Interpretation:  Sinus rhythm Confirmed by Quintella Reichert [phone]) on 05/03/2018 6:12:21 PM        Radiology Dg Chest 2 View  Result Date: 05/03/2018 CLINICAL DATA:  Shortness of breath and chest tightness for  approximately 1 week. EXAM: CHEST - 2 VIEW COMPARISON:  PA and lateral chest 08/27/2015.  CT chest 03/10/2015. FINDINGS: The lungs are clear. Heart size is normal. There is no pneumothorax or pleural fluid. No bony abnormality. IMPRESSION: Normal chest. Electronically Signed   By: Inge Rise M.D.   On: 05/03/2018 13:42    Procedures Procedures (including critical care time)  Medications Ordered in ED Medications  alum & mag hydroxide-simeth (MAALOX/MYLANTA) 200-200-20 MG/5ML suspension 30 mL (30 mLs Oral Given 05/03/18 1730)    And  lidocaine (XYLOCAINE) 2 % viscous mouth solution 15 mL (15 mLs Oral Given 05/03/18 1731)  ipratropium-albuterol (DUONEB) 0.5-2.5 (3) MG/3ML nebulizer solution 3 mL (3 mLs Nebulization Given 05/03/18 1732)  albuterol (PROVENTIL HFA;VENTOLIN HFA) 108 (90 Base) MCG/ACT inhaler 2 puff (2 puffs Inhalation Given 05/03/18 1902)  AeroChamber Plus Flo-Vu Medium MISC 1 each (1 each Other Given 05/03/18 1903)     Initial Impression / Assessment and Plan / ED Course  I have reviewed the triage vital signs and the nursing notes.  Pertinent labs & imaging results that were available during my care of the patient were reviewed by me and considered in my medical decision making (see chart for details).  Clinical Course as of May 03 2142  Fri May 03, 2018  1717 RN aware of med orders   [EH]  9629 After neb treatment  Pulse Rate(!): 106 [EH]    Clinical Course User Index [EH] Lorin Glass, PA-C      Patient presents today for evaluation of chest pain and chest tightness with shortness of breath.  She has been being treated for seasonal allergies by her PCP  however reportedly desatted to the 70s while there.  Here she is not hypoxic.  She is not orthostatic.  She personally is PERC negative, however her mother has a history of multiple blood clots.  D-dimer was ordered during triage which was not elevated.  Her white count is slightly low at 3.7, however she is not anemic, and does not have any significant electrolyte derangements.  BNP is not elevated.  She is not pregnant.  Chest x-ray is unremarkable.  EKG obtained x2 without evidence of pericarditis or ischemia.  I suspect that she has seasonal allergies causing a cough which is causing musculoskeletal based chest discomfort.  She is given an albuterol neb while in the department which she said made her feel jittery, however did not provide any significant relief.  We will give her an albuterol inhaler per her request.  I do not suspect ACS.  She is both PERC and d-dimer negative, I do not suspect PE, she does not have any calf swelling suggestive of a DVT.  She was able to ambulate here without desaturation or significant worsening of condition.  She did become slightly tachycardic after albuterol.  Recommended medicines for seasonal allergy, she is already on nasal steroids, and I recommended addition of second-generation antihistamine.  Her lungs remain clear to auscultation bilaterally, so therefore I feel that her risk benefit of prednisone weighs more towards risk at this time.  Recommended nasal rinses, and other conservative measures.  She says that she will be going back to her primary care doctor tomorrow for recheck.  Return precautions were discussed with patient who states their understanding.  At the time of discharge patient denied any unaddressed complaints or concerns.  Patient is agreeable for discharge home.   Final Clinical Impressions(s) / ED Diagnoses   Final diagnoses:  Feeling  of chest tightness  Cough  Post-nasal drip    ED Discharge Orders    None       Ollen Gross 05/03/18 2144    Milton Ferguson, MD 05/05/18 [phone]

## 2018-05-03 NOTE — ED Notes (Signed)
Ambulated with pt and O2 remained at 100%.  She had no shortness of breath or dizziness.  She said she felt a little chest tightness.

## 2018-05-03 NOTE — ED Notes (Signed)
D/c paperwork reviewed with pt.  No concerns or questions noted.  Pt denies any chest pain at this time.  Pt ambulatory on departure.

## 2018-05-03 NOTE — ED Notes (Signed)
Pt ambulated to bathroom and back, labored breathing upon return. Pt reconnected to O2 monitoring. Will continue to monitor.

## 2018-05-03 NOTE — ED Notes (Signed)
Bed: DZ32 Expected date:  Expected time:  Means of arrival:  Comments: EMS-cancer patient/syncope

## 2018-05-03 NOTE — ED Triage Notes (Signed)
Per EMS- pt transported from PCP with c/o 2 day hx shortness of breath exertion. Per PCP , ambulation causing rapid desaturation. Sat decreased 70%. Pt is currently alert, oriented and appropriate. Denies fever. Reports sinus drainage, denies cough. Family enroute

## 2018-05-03 NOTE — ED Provider Notes (Signed)
MSE was initiated and I personally evaluated the patient and placed orders (if any) at  1:39 PM on May 03, 2018.  Kelli Cobb is a 28 y.o. female with a PMHx of anxiety, who presents to the ED with complaints of shortness of breath and chest tightness/pain for the last 2 days.  She states that her symptoms started a few days ago with nasal drainage, she was seen by her PCP and treated for allergies, but then today she was seen again by her PCP because she was getting worse and having shortness of breath.  She is also noticed a dry cough as well as orthopnea, stating that she is needing to stay upright in order to be comfortable.  Her PCPs office gave her a nebulizer treatment which seemed to worsen her symptoms and did not help.  They checked her oxygen saturations when she ambulated and patient states that they decreased into the "70s" with ambulation.  They checked on multiple fingers and got the same results.  They decided to transport her to the ED for further evaluation.  Patient denies any fevers or sore throat.  She denies any leg swelling, recent travel/surgery/immobilization, estrogen use, or personal history of DVT/PE, however she has a family history of PE in her mother.  On exam, lung sounds clear, no wheezing/rhonchi/rales, SpO2 99% on RA at rest, mildly tachycardic in the low 100s, reg rhythm, nl s1/s2, no m/r/g.   The patient appears stable so that the remainder of the MSE may be completed by another provider.     [address] [place], [place] 05/03/18 1343    Milton Ferguson, MD 05/03/18 [phone]

## 2018-11-07 LAB — OB RESULTS CONSOLE GC/CHLAMYDIA
Chlamydia: NEGATIVE
Gonorrhea: NEGATIVE

## 2018-11-07 LAB — OB RESULTS CONSOLE ANTIBODY SCREEN: Antibody Screen: NEGATIVE

## 2018-11-07 LAB — OB RESULTS CONSOLE HEPATITIS B SURFACE ANTIGEN: Hepatitis B Surface Ag: NEGATIVE

## 2018-11-07 LAB — OB RESULTS CONSOLE RUBELLA ANTIBODY, IGM: Rubella: IMMUNE

## 2018-11-07 LAB — OB RESULTS CONSOLE ABO/RH: RH Type: POSITIVE

## 2018-11-07 LAB — OB RESULTS CONSOLE RPR: RPR: NONREACTIVE

## 2018-11-07 LAB — OB RESULTS CONSOLE HIV ANTIBODY (ROUTINE TESTING): HIV: NONREACTIVE

## 2019-01-14 ENCOUNTER — Other Ambulatory Visit: Payer: Self-pay

## 2019-01-14 ENCOUNTER — Encounter: Payer: Self-pay | Admitting: Emergency Medicine

## 2019-01-14 DIAGNOSIS — Z79899 Other long term (current) drug therapy: Secondary | ICD-10-CM | POA: Insufficient documentation

## 2019-01-14 DIAGNOSIS — O99891 Other specified diseases and conditions complicating pregnancy: Secondary | ICD-10-CM | POA: Insufficient documentation

## 2019-01-14 DIAGNOSIS — R109 Unspecified abdominal pain: Secondary | ICD-10-CM | POA: Insufficient documentation

## 2019-01-14 DIAGNOSIS — Z3A19 19 weeks gestation of pregnancy: Secondary | ICD-10-CM | POA: Diagnosis not present

## 2019-01-14 DIAGNOSIS — D251 Intramural leiomyoma of uterus: Secondary | ICD-10-CM | POA: Diagnosis not present

## 2019-01-14 LAB — URINALYSIS, COMPLETE (UACMP) WITH MICROSCOPIC
Bilirubin Urine: NEGATIVE
Glucose, UA: NEGATIVE mg/dL
Hgb urine dipstick: NEGATIVE
Ketones, ur: NEGATIVE mg/dL
Leukocytes,Ua: NEGATIVE
Nitrite: NEGATIVE
Protein, ur: NEGATIVE mg/dL
Specific Gravity, Urine: 1.018 (ref 1.005–1.030)
pH: 6 (ref 5.0–8.0)

## 2019-01-14 LAB — CBC WITH DIFFERENTIAL/PLATELET
Abs Immature Granulocytes: 0.04 10*3/uL (ref 0.00–0.07)
Basophils Absolute: 0.1 10*3/uL (ref 0.0–0.1)
Basophils Relative: 1 %
Eosinophils Absolute: 0.1 10*3/uL (ref 0.0–0.5)
Eosinophils Relative: 1 %
HCT: 34.5 % — ABNORMAL LOW (ref 36.0–46.0)
Hemoglobin: 11.2 g/dL — ABNORMAL LOW (ref 12.0–15.0)
Immature Granulocytes: 0 %
Lymphocytes Relative: 23 %
Lymphs Abs: 2.2 10*3/uL (ref 0.7–4.0)
MCH: 25.1 pg — ABNORMAL LOW (ref 26.0–34.0)
MCHC: 32.5 g/dL (ref 30.0–36.0)
MCV: 77.4 fL — ABNORMAL LOW (ref 80.0–100.0)
Monocytes Absolute: 1.1 10*3/uL — ABNORMAL HIGH (ref 0.1–1.0)
Monocytes Relative: 12 %
Neutro Abs: 6 10*3/uL (ref 1.7–7.7)
Neutrophils Relative %: 63 %
Platelets: 319 10*3/uL (ref 150–400)
RBC: 4.46 MIL/uL (ref 3.87–5.11)
RDW: 14.5 % (ref 11.5–15.5)
WBC: 9.5 10*3/uL (ref 4.0–10.5)
nRBC: 0 % (ref 0.0–0.2)

## 2019-01-14 LAB — COMPREHENSIVE METABOLIC PANEL
ALT: 16 U/L (ref 0–44)
AST: 20 U/L (ref 15–41)
Albumin: 3.6 g/dL (ref 3.5–5.0)
Alkaline Phosphatase: 35 U/L — ABNORMAL LOW (ref 38–126)
Anion gap: 11 (ref 5–15)
BUN: 6 mg/dL (ref 6–20)
CO2: 21 mmol/L — ABNORMAL LOW (ref 22–32)
Calcium: 8.8 mg/dL — ABNORMAL LOW (ref 8.9–10.3)
Chloride: 102 mmol/L (ref 98–111)
Creatinine, Ser: 0.46 mg/dL (ref 0.44–1.00)
GFR calc Af Amer: >60 mL/min (ref >60)
GFR calc non Af Amer: >60 mL/min (ref >60)
Glucose, Bld: 83 mg/dL (ref 70–99)
Potassium: 3.5 mmol/L (ref 3.5–5.1)
Sodium: 134 mmol/L — ABNORMAL LOW (ref 135–145)
Total Bilirubin: 0.3 mg/dL (ref 0.3–1.2)
Total Protein: 7.4 g/dL (ref 6.5–8.1)

## 2019-01-14 LAB — ABO/RH: ABO/RH(D): O POS

## 2019-01-14 LAB — HCG, QUANTITATIVE, PREGNANCY: hCG, Beta Chain, Quant, S: 11267 m[IU]/mL — ABNORMAL HIGH (ref <5)

## 2019-01-14 NOTE — ED Triage Notes (Signed)
Pt to triage via w/c with no distress noted, mask in place; pt reports abd cramping this evening with no bleeding; pt approx 18wks, G1, pt at [employer] with no complications; [place] 5/2

## 2019-01-15 ENCOUNTER — Emergency Department: Payer: Managed Care, Other (non HMO)

## 2019-01-15 ENCOUNTER — Emergency Department
Admission: EM | Admit: 2019-01-15 | Discharge: 2019-01-15 | Disposition: A | Discharge status: Home / Self Care | Payer: Managed Care, Other (non HMO) | Attending: Emergency Medicine | Admitting: Emergency Medicine

## 2019-01-15 DIAGNOSIS — D251 Intramural leiomyoma of uterus: Secondary | ICD-10-CM

## 2019-01-15 DIAGNOSIS — Z3492 Encounter for supervision of normal pregnancy, unspecified, second trimester: Secondary | ICD-10-CM

## 2019-01-15 LAB — WET PREP, GENITAL
Clue Cells Wet Prep HPF POC: NONE SEEN
Sperm: NONE SEEN
Trich, Wet Prep: NONE SEEN
Yeast Wet Prep HPF POC: NONE SEEN

## 2019-01-15 MED ORDER — SODIUM CHLORIDE 0.9 % IV BOLUS
1000.0000 mL | Freq: Once | INTRAVENOUS | Status: AC
  Administered 2019-01-15: 08:00:00 1000 mL via INTRAVENOUS

## 2019-01-15 MED ORDER — LIDOCAINE VISCOUS HCL 2 % MT SOLN
15.0000 mL | Freq: Once | OROMUCOSAL | Status: DC
  Filled 2019-01-15: qty 15

## 2019-01-15 MED ORDER — INDOMETHACIN 25 MG PO CAPS
25.0000 mg | ORAL_CAPSULE | Freq: Once | ORAL | Status: AC
  Administered 2019-01-15: 25 mg via ORAL
  Filled 2019-01-15: qty 1

## 2019-01-15 MED ORDER — MORPHINE SULFATE (PF) 4 MG/ML IV SOLN
4.0000 mg | Freq: Once | INTRAVENOUS | Status: AC
  Administered 2019-01-15: 4 mg via INTRAVENOUS
  Filled 2019-01-15: qty 1

## 2019-01-15 MED ORDER — ONDANSETRON HCL 4 MG/2ML IJ SOLN
4.0000 mg | Freq: Once | INTRAMUSCULAR | Status: AC
  Administered 2019-01-15: 08:00:00 4 mg via INTRAVENOUS

## 2019-01-15 MED ORDER — DICYCLOMINE HCL 10 MG PO CAPS
20.0000 mg | ORAL_CAPSULE | Freq: Once | ORAL | Status: AC
  Administered 2019-01-15: 05:00:00 20 mg via ORAL
  Filled 2019-01-15: qty 2

## 2019-01-15 MED ORDER — INDOMETHACIN 25 MG PO CAPS: 25.0000 mg | ORAL_CAPSULE | Freq: Three times a day (TID) | ORAL | 0 refills | Status: AC

## 2019-01-15 MED ORDER — OXYCODONE-ACETAMINOPHEN 5-325 MG PO TABS
2.0000 | ORAL_TABLET | Freq: Once | ORAL | Status: AC
  Administered 2019-01-15: 2 via ORAL
  Filled 2019-01-15: qty 2

## 2019-01-15 MED ORDER — ONDANSETRON HCL 4 MG/2ML IJ SOLN
INTRAMUSCULAR | Status: AC
  Administered 2019-01-15: 08:00:00 4 mg via INTRAVENOUS
  Filled 2019-01-15: qty 2

## 2019-01-15 MED ORDER — OXYCODONE-ACETAMINOPHEN 7.5-325 MG PO TABS: 1.0000 | ORAL_TABLET | ORAL | 0 refills | Status: DC | PRN

## 2019-01-15 NOTE — ED Notes (Signed)
When injecting the morphine slow IV push, pt had injection site reaction, felt "severe itching", however, when flushed with IV saline, pt states the itching subsided. 1/2 morphine dose given, will waste other half. Pt taken to MRI, fluids intact, pt denies complaints.

## 2019-01-15 NOTE — ED Provider Notes (Signed)
Patient seen and examined by me, still having pain which is likely from a degenerating fibroid.  I have discussed with her OB/GYN doctor in [place] who recommends 48 hours of Indocin and Percocet.  She agrees to this and is cleared for outpatient follow-up.   Earleen Newport, MD 01/15/19 [phone]

## 2019-01-15 NOTE — ED Notes (Signed)
Pt resting in bed upon intial assessment, no c/o new pain, states abd pain 6/10 at this time. Assisted into gown and to the bsc, tolerated well. Husband at bedside. Bed locked and low, call bell in reach, urine sent to lab per order.

## 2019-01-15 NOTE — Consult Note (Signed)
Consult History and Physical   SERVICE: Obstetrics  Patient Name: Kelli Cobb Patient MRN:   [id number]  CC: Abdominal pain  HPI: Kelli Cobb is a 28 y.o. G1P0000 with intermittent abdominal pain for 12 hours, worsening over time. She is [redacted]w[redacted]d with an EDD of 06/15/2019. Pain is sharp and cramping, starts abruptly about 2cm above umbilicus and spreads to suprapubic area. No dysuria. No nausea, vomiting, anorexia, constipation or diarrhea. Stools are soft, 2x daily. She has a hx of food poisoning, and this pain is very different from bowel spasms. No back pain, no flank pain. Worsens with coughing, movement, voiding, palpation. Initially soothed by heating pad, now not helping as much. Bentyl has not helped in the ER.  She denies vaginal bleeding, vaginal discharge, itching/burning.  Anatomy ultrasound scheduled for 2 days from now.  Ultrasound confirmed IUP with fetal heart tones in 160s, 4cm cervical length, intact placenta. She is O pos blood type.  No prior surgeries. Spontaneous pregnancy. No meds except prenatal vitamins.   Review of Systems: positives in bold GEN:   fevers, chills, weight changes, appetite changes, fatigue, night sweats HEENT:  HA, vision changes, hearing loss, congestion, rhinorrhea, sinus pressure, dysphagia CV:   CP, palpitations PULM:  SOB, cough GI:  abd pain, N/V/D/C GU:  dysuria, urgency, frequency MSK:  arthralgias, myalgias, back pain, swelling SKIN:  rashes, color changes, pallor NEURO:  numbness, weakness, tingling, seizures, dizziness, tremors PSYCH:  depression, anxiety, behavioral problems, confusion  HEME/LYMPH:  easy bruising or bleeding ENDO:  heat/cold intolerance  Past Obstetrical History: OB History    Gravida  1   Para  0   Term  0   Preterm  0   AB  0   Living  0     SAB  0   TAB  0   Ectopic  0   Multiple  0   Live Births  0           Past Gynecologic History: None Regular periods  Past Medical  History: Past Medical History:  Diagnosis Date  . Anxiety   . Syncope     Past Surgical History:   Past Surgical History:  Procedure Laterality Date  . NO PAST SURGERIES      Family History:  family history includes Clotting disorder in her mother; Hypertension in her mother.  Social History:  Social History   Socioeconomic History  . Marital status: Married    Spouse name: Not on file  . Number of children: Not on file  . Years of education: Not on file  . Highest education level: Not on file  Occupational History  . Not on file  Social Needs  . Financial resource strain: Not on file  . Food insecurity    Worry: Not on file    Inability: Not on file  . Transportation needs    Medical: Not on file    Non-medical: Not on file  Tobacco Use  . Smoking status: Never Smoker  . Smokeless tobacco: Never Used  Substance and Sexual Activity  . Alcohol use: Yes    Alcohol/week: 0.0 standard drinks    Comment: occ  . Drug use: No  . Sexual activity: Yes    Birth control/protection: Condom  Lifestyle  . Physical activity    Days per week: Not on file    Minutes per session: Not on file  . Stress: Not on file  Relationships  . Social Herbalist on phone:  Not on file    Gets together: Not on file    Attends religious service: Not on file    Active member of club or organization: Not on file    Attends meetings of clubs or organizations: Not on file    Relationship status: Not on file  . Intimate partner violence    Fear of current or ex partner: Not on file    Emotionally abused: Not on file    Physically abused: Not on file    Forced sexual activity: Not on file  Other Topics Concern  . Not on file  Social History Narrative  . Not on file    Home Medications:  Medications reconciled in EPIC  No current facility-administered medications on file prior to encounter.    Current Outpatient Medications on File Prior to Encounter  Medication Sig  Dispense Refill  . mometasone (NASONEX) 50 MCG/ACT nasal spray Place 2 sprays into the nose 2 (two) times daily.    . Multiple Vitamin (MULTIVITAMIN) tablet Take 1 tablet by mouth daily.    Marland Kitchen OZEMPIC, 1 MG/DOSE, 2 MG/1.5ML SOPN Inject 1 mg into the skin once a week.      Allergies:  No Known Allergies  Physical Exam:  Temp:  [98.8 F (37.1 C)] 98.8 F (37.1 C) (12/01 2142) Pulse Rate:  [98-109] 109 (12/02 0616) Resp:  [18] 18 (12/01 2142) BP: (119-136)/(65-66) 119/66 (12/02 0615) SpO2:  [100 %] 100 % (12/02 0616) Weight:  [91.6 kg] 91.6 kg (12/01 2143)   General Appearance:  Well developed, well nourished, no acute distress, alert and oriented x3 HEENT:  Normocephalic atraumatic, extraocular movements intact, moist mucous membranes Cardiovascular:  Normal S1/S2, regular rate and rhythm, no murmurs Pulmonary:  clear to auscultation, no wheezes, rales or rhonchi, symmetric air entry, good air exchange Abdomen:  Bowel sounds present, soft, significantly tender to palpation, nondistended, no abnormal masses, epigastric pain, almost peritoneal but more superficial on touching the skin Extremities:  Full range of motion, no pedal edema, 2+ distal pulses, no tenderness Skin:  normal coloration and turgor, no rashes, no suspicious skin lesions noted  Neurologic:  Cranial nerves 2-12 grossly intact, normal muscle tone, strength 5/5 all four extremities Psychiatric:  Normal mood and affect, appropriate, no AH/VH Pelvic:  NEFG, no vulvar masses or lesions, normal vaginal mucosa, no vaginal bleeding or discharge, cervix without lesions or erythema, enlarged gravid uterus, no adnexal masses appreciated. No cervical motion tenderness   Labs/Studies:   CBC and Coags:  Lab Results  Component Value Date   WBC 9.5 01/14/2019   NEUTOPHILPCT 63 01/14/2019   EOSPCT 1 01/14/2019   BASOPCT 1 01/14/2019   LYMPHOPCT 23 01/14/2019   HGB 11.2 (L) 01/14/2019   HCT 34.5 (L) 01/14/2019   MCV 77.4 (L)  01/14/2019   PLT 319 01/14/2019   CMP:  Lab Results  Component Value Date   NA 134 (L) 01/14/2019   K 3.5 01/14/2019   CL 102 01/14/2019   CO2 21 (L) 01/14/2019   BUN 6 01/14/2019   CREATININE 0.46 01/14/2019   CREATININE 0.64 05/03/2018   CREATININE 0.67 08/26/2017   PROT 7.4 01/14/2019   BILITOT 0.3 01/14/2019   ALT 16 01/14/2019   AST 20 01/14/2019   ALKPHOS 35 (L) 01/14/2019    Other Imaging: US Ob Limited  Result Date: 01/15/2019 CLINICAL DATA:  Abdominal pain EXAM: LIMITED OBSTETRIC ULTRASOUND FINDINGS: Number of Fetuses: 1 Heart Rate:  169 bpm Movement: Yes Presentation: Breech Placental Location:  Posterior Previa: No Amniotic Fluid (Subjective):  Within normal limits. BPD: 4.4 cm cm 19 w  2 d MATERNAL FINDINGS: Cervix:  Appears closed. Uterus/Adnexae: There is a heterogeneous partially exophytic uterine fibroid measuring approximately 5.9 x 5.8 x 6.2 cm. Heart Rate:  169 bpm IMPRESSION: Single live intrauterine pregnancy measuring 19 weeks 2 days This exam is performed on an emergent basis and does not comprehensively evaluate fetal size, dating, or anatomy; follow-up complete OB US should be considered if further fetal assessment is warranted. Electronically Signed   By: Prudencio Pair M.D.   On: 01/15/2019 01:07     Assessment / Plan:   Kelli Cobb is a 28 y.o. G1P0000 who presents with abdominal pain in 2nd trimester. Prenatal care at [facility] in [place].  1. Obstetric DDx includes intrauterine infection, preterm labor, cervical insufficiency, necrotic fibroid, bacterial vaginosis or yeast, UTI, constipation. Appendicitis can present abnormally high in pregnancy. - no physical findings on exam to suggest cervical or uterine infection - wet prep returned negative - f/u on GC/CT - reassuring ultrasound with normal cervical length and cervix closed and thick on my exam, without tenderness or friability, arguing against preterm labor or cervical insufficiency, and against  STI, PID, or chorio - WBC wnl - consider MRI to evaluate other causes, as no obstetric findings today - Recommend keeping level II ultrasound tomorrow.   Thank you for the opportunity to be involved with this pt's care.

## 2019-01-15 NOTE — ED Notes (Signed)
Per MD, hold off on viscous lidocaine until OB sees patient

## 2019-01-15 NOTE — ED Notes (Signed)
Pt back in room. No distress noted. States no itching at IV site, c/o pain stating morphine didn't work.

## 2019-01-15 NOTE — ED Notes (Signed)
Dr. Leafy Ro OB at bedside

## 2019-01-15 NOTE — ED Provider Notes (Signed)
[facility] Emergency Department Provider Note  ____________________________________________   I have reviewed the triage vital signs and the nursing notes.   HISTORY  Chief Complaint Abdominal Pain   History limited by: Not Limited   HPI Kelli Cobb is a 28 y.o. female who presents to the emergency department today because of concerns for abdominal pain.  She states is located in the center part of her abdomen.  She describes it as cramping and feeling like it gets twisted in a knot.  The pain does come and go in waves.  The patient is pregnant.  She denies similar pain so far this pregnancy.  Denies any vaginal bleeding.  She has not had any significant nausea or vomiting with this pain.  No fevers.  Denies similar pain in the past.  Records reviewed. Per medical record review patient has a history of syncope.   Past Medical History:  Diagnosis Date  . Anxiety   . Syncope     Patient Active Problem List   Diagnosis Date Noted  . Chest pain 01/08/2016    Past Surgical History:  Procedure Laterality Date  . NO PAST SURGERIES      Prior to Admission medications   Medication Sig Start Date End Date Taking? Authorizing Provider  mometasone (NASONEX) 50 MCG/ACT nasal spray Place 2 sprays into the nose 2 (two) times daily. 04/30/18   [provider]  Multiple Vitamin (MULTIVITAMIN) tablet Take 1 tablet by mouth daily.    [provider]  OZEMPIC, 1 MG/DOSE, 2 MG/1.5ML SOPN Inject 1 mg into the skin once a week. 04/15/18   [provider]    Allergies Patient has no known allergies.  Family History  Problem Relation Age of Onset  . Clotting disorder Mother   . Hypertension Mother     Social History Social History   Tobacco Use  . Smoking status: Never Smoker  . Smokeless tobacco: Never Used  Substance Use Topics  . Alcohol use: Yes    Alcohol/week: 0.0 standard drinks    Comment: occ  . Drug use: No    Review  of Systems Constitutional: No fever/chills Eyes: No visual changes. ENT: No sore throat. Cardiovascular: Denies chest pain.  Respiratory: Denies shortness of breath. Gastrointestinal: Positive for abdominal pain.  Genitourinary: Negative for dysuria. Musculoskeletal: Negative for back pain. Skin: Negative for rash. Neurological: Negative for headaches, focal weakness or numbness.  ____________________________________________   PHYSICAL EXAM:  VITAL SIGNS: ED Triage Vitals  Enc Vitals Group     BP 01/14/19 2142 136/65     Pulse Rate 01/14/19 2142 98     Resp 01/14/19 2142 18     Temp 01/14/19 2142 98.8 F (37.1 C)     Temp Source 01/14/19 2142 Oral     SpO2 01/14/19 2142 100 %     Weight 01/14/19 2143 202 lb (91.6 kg)     Height 01/14/19 2143 5\' 7"  (1.702 m)     Head Circumference --      Peak Flow --      Pain Score 01/14/19 2149 6   Constitutional: Alert and oriented.  Eyes: Conjunctivae are normal.  ENT      Head: Normocephalic and atraumatic.      Nose: No congestion/rhinnorhea.      Mouth/Throat: Mucous membranes are moist.      Neck: No stridor. Hematological/Lymphatic/Immunilogical: No cervical lymphadenopathy. Cardiovascular: Normal rate, regular rhythm.  No murmurs, rubs, or gallops.  Respiratory: Normal respiratory effort  without tachypnea nor retractions. Breath sounds are clear and equal bilaterally. No wheezes/rales/rhonchi. Gastrointestinal: Gravid. Tender to palpation in bilateral lower quadrants. No rebound.  Genitourinary: Deferred Musculoskeletal: Normal range of motion in all extremities. No lower extremity edema. Neurologic:  Normal speech and language. No gross focal neurologic deficits are appreciated.  Skin:  Skin is warm, dry and intact. No rash noted. Psychiatric: Mood and affect are normal. Speech and behavior are normal. Patient exhibits appropriate insight and judgment.  ____________________________________________    LABS (pertinent  positives/negatives)  CMP na 134, k 3.5, glu 83, cr 0.46 UA clear, 0-5 rbc and wbc CBC wbc 9.5, hgb 11.2, plt 319 ____________________________________________   EKG  None  ____________________________________________    RADIOLOGY  US ob Single live intrauterine pregnancy   ____________________________________________   PROCEDURES  Procedures  ____________________________________________   INITIAL IMPRESSION / ASSESSMENT AND PLAN / ED COURSE  Pertinent labs & imaging results that were available during my care of the patient were reviewed by me and considered in my medical decision making (see chart for details).   Patient who is [redacted] weeks pregnant presents to the emergency department today because of concerns for abdominal pain.  Abdominal pain is located in the center part of her abdomen although she was tender in bilateral lower quadrants.  Initially did try Bentyl in case patient was experiencing intestinal cramping.  She had no significant relief.  I discussed with Dr. Leafy Ro with OB/GYN who came and evaluated the patient.  At this point I have low suspicion for preterm contractions given reassuring exam and ultrasound.  Patient however continued to have discomfort.  Will obtain MRI to evaluate for potential intra-abdominal infection.  ____________________________________________   FINAL CLINICAL IMPRESSION(S) / ED DIAGNOSES  Abdominal pain  Note: This dictation was prepared with Dragon dictation. Any transcriptional errors that result from this process are unintentional     Nance Pear, MD 01/15/19 [phone]

## 2019-01-17 LAB — GC/CHLAMYDIA PROBE AMP
Chlamydia trachomatis, NAA: NEGATIVE
Neisseria Gonorrhoeae by PCR: NEGATIVE

## 2019-02-14 NOTE — L&D Delivery Note (Addendum)
Delivery Note At 9:53 PM a viable and healthy female was delivered via Vaginal, Spontaneous (Presentation: Occiput Anterior).  APGAR: 9, 9; weight  pending.   Placenta status: Spontaneous, Intact.  Cord: 3 vessels with the following complications: None.  Cord pH: NA  Anesthesia: Epidural;Local Episiotomy: None Lacerations: 2nd degree Suture Repair: 2.0 vicryl rapide Est. Blood Loss (mL):  730  Mom to postpartum.  Baby to Couplet care / Skin to Skin.  Cefoxitin for fever/ presumed chorioamnionitis arrived after 1 hr of ordering. Will hang now   [name] 05/30/2019, 10:29 PM

## 2019-02-15 ENCOUNTER — Inpatient Hospital Stay (HOSPITAL_COMMUNITY)
Admission: AD | Admit: 2019-02-15 | Discharge: 2019-02-15 | Disposition: A | Discharge status: Home / Self Care | Payer: Managed Care, Other (non HMO) | Attending: Obstetrics | Admitting: Obstetrics

## 2019-02-15 ENCOUNTER — Encounter (HOSPITAL_COMMUNITY): Payer: Self-pay | Admitting: Obstetrics and Gynecology

## 2019-02-15 ENCOUNTER — Other Ambulatory Visit: Payer: Self-pay

## 2019-02-15 DIAGNOSIS — O99891 Other specified diseases and conditions complicating pregnancy: Secondary | ICD-10-CM | POA: Insufficient documentation

## 2019-02-15 DIAGNOSIS — O3412 Maternal care for benign tumor of corpus uteri, second trimester: Secondary | ICD-10-CM | POA: Diagnosis not present

## 2019-02-15 DIAGNOSIS — Z7951 Long term (current) use of inhaled steroids: Secondary | ICD-10-CM | POA: Insufficient documentation

## 2019-02-15 DIAGNOSIS — R1011 Right upper quadrant pain: Secondary | ICD-10-CM | POA: Diagnosis not present

## 2019-02-15 DIAGNOSIS — D259 Leiomyoma of uterus, unspecified: Secondary | ICD-10-CM | POA: Diagnosis present

## 2019-02-15 DIAGNOSIS — Z885 Allergy status to narcotic agent status: Secondary | ICD-10-CM | POA: Insufficient documentation

## 2019-02-15 DIAGNOSIS — R109 Unspecified abdominal pain: Secondary | ICD-10-CM

## 2019-02-15 DIAGNOSIS — O26892 Other specified pregnancy related conditions, second trimester: Secondary | ICD-10-CM | POA: Diagnosis present

## 2019-02-15 DIAGNOSIS — Z3A22 22 weeks gestation of pregnancy: Secondary | ICD-10-CM | POA: Insufficient documentation

## 2019-02-15 HISTORY — DX: Benign neoplasm of connective and other soft tissue, unspecified: D21.9

## 2019-02-15 LAB — URINALYSIS, ROUTINE W REFLEX MICROSCOPIC
Bilirubin Urine: NEGATIVE
Glucose, UA: NEGATIVE mg/dL
Hgb urine dipstick: NEGATIVE
Ketones, ur: NEGATIVE mg/dL
Leukocytes,Ua: NEGATIVE
Nitrite: NEGATIVE
Protein, ur: NEGATIVE mg/dL
Specific Gravity, Urine: 1.019 (ref 1.005–1.030)
pH: 5 (ref 5.0–8.0)

## 2019-02-15 MED ORDER — ONDANSETRON 4 MG PO TBDP
8.0000 mg | ORAL_TABLET | Freq: Once | ORAL | Status: AC
  Administered 2019-02-15: 8 mg via ORAL
  Filled 2019-02-15: qty 2

## 2019-02-15 MED ORDER — OXYCODONE-ACETAMINOPHEN 5-325 MG PO TABS
1.0000 | ORAL_TABLET | Freq: Once | ORAL | Status: AC
  Administered 2019-02-15: 1 via ORAL
  Filled 2019-02-15: qty 1

## 2019-02-15 NOTE — MAU Provider Note (Signed)
History     CSN: [id number]  Arrival date and time: 02/15/19 1401   First Provider Initiated Contact with Patient 02/15/19 1517      Chief Complaint  Patient presents with  . Abdominal Pain   HPI  Ms.  Kelli Cobb is a 29 y.o. year old G25P0000 female at [redacted]w[redacted]d weeks gestation who presents to MAU reporting RT upper abdominal pain (in the location of her fibroids) that started suddenly 3 hours prior to her arrival to MAU. She was seen in an Emergency room on 01/15/2019 for the same complaint. She has since been prescribed Percocet 7.5 mg/325 mg every 4 hours for the pain and Phenergan 25 mg every 6 hours for nausea. She reports taking 0.5 tablet of the Percocet at noon today and 1 tablet of the Phenergan "before coming" to MAU. She says that "helped some, but pain is still painful and tight feeling." She reports her abdomen is "real tender." Pain is rated 7/10. She denies vaginal bleeding or abnormal vaginal discharge. She reports good (+) FM.  Past Medical History:  Diagnosis Date  . Anxiety   . Fibroids   . Syncope     Past Surgical History:  Procedure Laterality Date  . NO PAST SURGERIES      Family History  Problem Relation Age of Onset  . Clotting disorder Mother   . Hypertension Mother     Social History   Tobacco Use  . Smoking status: Never Smoker  . Smokeless tobacco: Never Used  Substance Use Topics  . Alcohol use: Yes    Alcohol/week: 0.0 standard drinks    Comment: occ  . Drug use: No    Allergies:  Allergies  Allergen Reactions  . Morphine And Related Itching    Medications Prior to Admission  Medication Sig Dispense Refill Last Dose  . mometasone (NASONEX) 50 MCG/ACT nasal spray Place 2 sprays into the nose 2 (two) times daily.     . Multiple Vitamin (MULTIVITAMIN) tablet Take 1 tablet by mouth daily.     Marland Kitchen oxyCODONE-acetaminophen (PERCOCET) 7.5-325 MG tablet Take 1 tablet by mouth every 4 (four) hours as needed for severe pain. 20 tablet 0   .  OZEMPIC, 1 MG/DOSE, 2 MG/1.5ML SOPN Inject 1 mg into the skin once a week.       Review of Systems  Constitutional: Negative.   HENT: Negative.   Eyes: Negative.   Respiratory: Negative.   Cardiovascular: Negative.   Gastrointestinal: Positive for abdominal pain.  Endocrine: Negative.   Genitourinary: Negative.   Musculoskeletal: Negative.   Skin: Negative.   Allergic/Immunologic: Negative.   Neurological: Negative.   Hematological: Negative.   Psychiatric/Behavioral: Negative.    Physical Exam   Blood pressure 128/67, pulse (!) 114, temperature 98.2 F (36.8 C), temperature source Oral, resp. rate 17, weight 93.8 kg, last menstrual period 04/04/2018, SpO2 100 %.  Physical Exam  Nursing note and vitals reviewed. Constitutional: She is oriented to person, place, and time. She appears well-developed and well-nourished.  HENT:  Head: Normocephalic and atraumatic.  Eyes: Pupils are equal, round, and reactive to light.  Cardiovascular: Normal rate.  Respiratory: Effort normal.  GI: There is abdominal tenderness (RUQ). There is no rebound and no guarding.  Genitourinary:    Genitourinary Comments: Not done; no contractions   Musculoskeletal:        General: Normal range of motion.     Cervical back: Normal range of motion.  Neurological: She is alert and oriented to person,  place, and time.  Skin: Skin is warm and dry.  Psychiatric: She has a normal mood and affect. Her behavior is normal. Judgment and thought content normal.   FHTs by doppler: 158 bpm  MAU Course  Procedures  MDM CCUA Percocet 5/325 mg -- pain down to a more tolerable level; rated 5/10 Zofran 8 mg ODT -- no vomiting from narcotic  Results for orders placed or performed during the hospital encounter of 02/15/19 (from the past 24 hour(s))  Urinalysis, Routine w reflex microscopic     Status: None   Collection Time: 02/15/19  2:35 PM  Result Value Ref Range   Color, Urine YELLOW YELLOW   APPearance  CLEAR CLEAR   Specific Gravity, Urine 1.019 1.005 - 1.030   pH 5.0 5.0 - 8.0   Glucose, UA NEGATIVE NEGATIVE mg/dL   Hgb urine dipstick NEGATIVE NEGATIVE   Bilirubin Urine NEGATIVE NEGATIVE   Ketones, ur NEGATIVE NEGATIVE mg/dL   Protein, ur NEGATIVE NEGATIVE mg/dL   Nitrite NEGATIVE NEGATIVE   Leukocytes,Ua NEGATIVE NEGATIVE    Assessment and Plan  Abdominal pain during pregnancy in second trimester - Advised to continue pain management plan as previously prescribed - Information provided on abdominal pain in pregnancy   Uterine fibroids affecting pregnancy in second trimester  - Advised to follow-up with Dr. Garwin Brothers for pain management plan - Information provided on uterine fibroids   - Discharge patient - Keep scheduled appointment with [facility] on Wednesday 02/19/2019  - Patient verbalized an understanding of the plan of care and agrees.  - Note for work given to husband per patient request    Laury Deep, MSN, CNM 02/15/2019, 3:17 PM

## 2019-02-15 NOTE — MAU Note (Signed)
Kelli Cobb is a 29 y.o. at [redacted]w[redacted]d here in MAU reporting:  +right abdominal pain.  Sudden onset. Constant. Endorses that it feels like the occurrence last month.  Tried 1 phenergan tablet and 1/2 an oxycodone/acetaminophen tablet at around noon. Endorses some relief but still painful and tight feeling. Onset of complaint: 3 hours ago Pain score: 7/10  States she does have fibroids.  Denies vaginal bleeding or discharge.  Vitals:   02/15/19 1434  BP: 128/67  Pulse: (!) 114  Resp: 17  Temp: 98.2 F (36.8 C)  SpO2: 100%    +FM Lab orders placed from triage: ua

## 2019-02-15 NOTE — Discharge Instructions (Signed)
Please take 1 Percocet every 4 hours as previously prescribed. Make sure you take your Phenergan 25 mg every 6 hours.

## 2019-03-28 ENCOUNTER — Encounter: Payer: Managed Care, Other (non HMO) | Attending: Obstetrics and Gynecology | Admitting: *Deleted

## 2019-03-28 ENCOUNTER — Encounter: Payer: Self-pay | Admitting: *Deleted

## 2019-03-28 ENCOUNTER — Other Ambulatory Visit: Payer: Self-pay

## 2019-03-28 VITALS — BP 104/68 | Ht 66.5 in | Wt 218.0 lb

## 2019-03-28 DIAGNOSIS — Z3A00 Weeks of gestation of pregnancy not specified: Secondary | ICD-10-CM | POA: Diagnosis not present

## 2019-03-28 DIAGNOSIS — O24410 Gestational diabetes mellitus in pregnancy, diet controlled: Secondary | ICD-10-CM | POA: Insufficient documentation

## 2019-03-28 NOTE — Progress Notes (Signed)
Diabetes Self-Management Education  Visit Type: First/Initial  Appt. Start Time: 1325 Appt. End Time: 1500  03/28/2019  Ms. Kelli Cobb, identified by name and date of birth, is a 29 y.o. female with a diagnosis of Diabetes: Gestational Diabetes.   ASSESSMENT  Blood pressure 104/68, height 5' 6.5" (1.689 m), weight 218 lb (98.9 kg), last menstrual period 09/06/2018, estimated date of delivery 06/15/2019 Body mass index is 34.66 kg/m.  Diabetes Self-Management Education - 03/28/19 1441      Visit Information   Visit Type  First/Initial      Initial Visit   Diabetes Type  Gestational Diabetes    Are you currently following a meal plan?  No    Are you taking your medications as prescribed?  Yes    Date Diagnosed  2/10//2021      Health Coping   How would you rate your overall health?  Good      Psychosocial Assessment   Patient Belief/Attitude about Diabetes  Motivated to manage diabetes   "upsetting"   Self-care barriers  None    Self-management support  Doctor's office;Family    Patient Concerns  Nutrition/Meal planning;Glycemic Control;Monitoring;Healthy Lifestyle    Special Needs  None    Preferred Learning Style  Auditory;Visual    Learning Readiness  Change in progress    How often do you need to have someone help you when you read instructions, pamphlets, or other written materials from your doctor or pharmacy?  1 - Never    What is the last grade level you completed in school?  Bachelors      Pre-Education Assessment   Patient understands the diabetes disease and treatment process.  Needs Instruction    Patient understands incorporating nutritional management into lifestyle.  Needs Instruction    Patient undertands incorporating physical activity into lifestyle.  Needs Instruction    Patient understands using medications safely.  Needs Instruction    Patient understands monitoring blood glucose, interpreting and using results  Needs Review    Patient understands  prevention, detection, and treatment of acute complications.  Needs Instruction    Patient understands prevention, detection, and treatment of chronic complications.  Needs Instruction    Patient understands how to develop strategies to address psychosocial issues.  Needs Instruction    Patient understands how to develop strategies to promote health/change behavior.  Needs Instruction      Complications   How often do you check your blood sugar?  3-4 times/day   just started checking blood sugars yesterday   Fasting Blood glucose range (mg/dL)  70-129   2 FBG's 99 and 92 mg/dL   Postprandial Blood glucose range (mg/dL)  70-129;130-179   pp's breakfast 112 and today 161; pp's lunch 94 and pp supper 154   Have you had a dilated eye exam in the past 12 months?  Yes    Have you had a dental exam in the past 12 months?  Yes    Are you checking your feet?  No      Dietary Intake   Breakfast  Smoothie with berries, Greek yogurt, almond milk and chia seeds; bagel; eggs and bacon with toast; apple    Snack (morning)  pretzels and cheese    Lunch  BLT; chicken wrap; salads    Snack (afternoon)  granola bar    Dinner  grilled chicken and occasional beef; veggie burger with black beans; potatoes, peas, beans, corn, brown rice, pasta, green beans, kale, greens, tomatoes, cuccumbers, broccoli, cauliflower  Snack (evening)  fruit, chips    Beverage(s)  water, 3 x week small tea with sugar      Exercise   Exercise Type  ADL's      Patient Education   Previous Diabetes Education  No    Disease state   Definition of diabetes, type 1 and 2, and the diagnosis of diabetes;Factors that contribute to the development of diabetes    Nutrition management   Role of diet in the treatment of diabetes and the relationship between the three main macronutrients and blood glucose level;Food label reading, portion sizes and measuring food.;Reviewed blood glucose goals for pre and post meals and how to evaluate the  patients' food intake on their blood glucose level.    Physical activity and exercise   Role of exercise on diabetes management, blood pressure control and cardiac health.    Medications  Other (comment)   Limited use of oral medications during pregnancy and potential for insulin   Monitoring  Purpose and frequency of SMBG.;Taught/discussed recording of test results and interpretation of SMBG.;Identified appropriate SMBG and/or A1C goals.;Ketone testing, when, how.    Chronic complications  Relationship between chronic complications and blood glucose control    Psychosocial adjustment  Identified and addressed patients feelings and concerns about diabetes    Preconception care  Pregnancy and GDM  Role of pre-pregnancy blood glucose control on the development of the fetus;Reviewed with patient blood glucose goals with pregnancy;Role of family planning for patients with diabetes      Individualized Goals (developed by patient)   Reducing Risk  Improve blood sugars Prevent diabetes complications Lead a healthier lifestyle Become more fit     Outcomes   Expected Outcomes  Demonstrated interest in learning. Expect positive outcomes       Individualized Plan for Diabetes Self-Management Training:   Learning Objective:  Patient will have a greater understanding of diabetes self-management. Patient education plan is to attend individual and/or group sessions per assessed needs and concerns.   Plan:   Patient Instructions  Read booklet on Gestational Diabetes Follow Gestational Meal Planning Guidelines Avoid fruit for breakfast and at bedtime Limit fried foods and those high in fat Complete a 3 Day Food Record and bring to next appointment Check blood sugars 4 x day - before breakfast and 2 hrs after every meal and record  Bring blood sugar log to all appointments Purchase urine ketone strips if instructed by MD and check urine ketones every am:  If + increase bedtime snack to 1 protein and  2 carbohydrate servings Walk 20-30 minutes at least 5 x week if permitted by MD  Expected Outcomes:  Demonstrated interest in learning. Expect positive outcomes  Education material provided:  Gestational Booklet Gestational Meal Planning Guidelines Simple Meal Plan Viewed Gestational Diabetes Video 3 Day Food Record Goals for a Healthy Pregnancy  If problems or questions, patient to contact team via:  Johny Drilling, [place], [place], CDE [phone]  Future DSME appointment:  April 03, 2019 with the dietitian

## 2019-03-28 NOTE — Patient Instructions (Signed)
Read booklet on Gestational Diabetes Follow Gestational Meal Planning Guidelines Avoid fruit for breakfast and at bedtime Limit fried foods and those high in fat Complete a 3 Day Food Record and bring to next appointment Check blood sugars 4 x day - before breakfast and 2 hrs after every meal and record  Bring blood sugar log to all appointments Purchase urine ketone strips if instructed by MD and check urine ketones every am:  If + increase bedtime snack to 1 protein and 2 carbohydrate servings Walk 20-30 minutes at least 5 x week if permitted by MD

## 2019-04-03 ENCOUNTER — Ambulatory Visit: Payer: Managed Care, Other (non HMO) | Admitting: Skilled Nursing Facility1

## 2019-04-07 ENCOUNTER — Encounter: Payer: Self-pay | Admitting: Dietician

## 2019-04-07 ENCOUNTER — Encounter: Payer: Managed Care, Other (non HMO) | Admitting: Dietician

## 2019-04-07 ENCOUNTER — Other Ambulatory Visit: Payer: Self-pay

## 2019-04-07 VITALS — BP 116/80 | Ht 66.5 in | Wt 218.9 lb

## 2019-04-07 DIAGNOSIS — O24410 Gestational diabetes mellitus in pregnancy, diet controlled: Secondary | ICD-10-CM | POA: Diagnosis not present

## 2019-04-07 NOTE — Patient Instructions (Signed)
   Make sure to eat something every 3-5 hours during the day. Smaller amounts more often might be better for stable blood sugars.   Keep carbs to 45grams with each meal.   Try moving for 10-15 minutes after supper and see if it helps nighttime blood sugars.

## 2019-04-07 NOTE — Progress Notes (Signed)
.   Patient's BG record indicates fasting BGs ranging 90-116, and post-meal BGs ranging 70-184. Highest readings are after dinner/ evening. . Patient's food recall from online tracking app indicates frequent restaurant meals and fried foods recently due to change in work schedule. She reports meal times vary from day to day, and she might be overeating at times due to excess hunger from missed snack or delayed meal.   . Provided detailed meal plan, and wrote individualized menus based on patient's food preferences. Discussed importance of consistent carb intake, appropriate portions, importance of eating at regular intervals.  . Instructed patient on food safety, including avoidance of Listeriosis, and limiting mercury from fish. . Discussed importance of maintaining healthy lifestyle habits to reduce risk of Type 2 DM as well as Gestational DM with any future pregnancies. . Advised patient to use any remaining testing supplies to test some BGs after delivery, and to have BG tested ideally annually, as well as prior to attempting future pregnancies.

## 2019-04-21 ENCOUNTER — Inpatient Hospital Stay (HOSPITAL_COMMUNITY): Payer: Managed Care, Other (non HMO)

## 2019-04-21 ENCOUNTER — Inpatient Hospital Stay (HOSPITAL_COMMUNITY)
Admission: AD | Admit: 2019-04-21 | Discharge: 2019-04-22 | Disposition: A | Discharge status: Home / Self Care | Payer: Managed Care, Other (non HMO) | Attending: Obstetrics and Gynecology | Admitting: Obstetrics and Gynecology

## 2019-04-21 ENCOUNTER — Other Ambulatory Visit: Payer: Self-pay

## 2019-04-21 ENCOUNTER — Encounter (HOSPITAL_COMMUNITY): Payer: Self-pay | Admitting: Obstetrics and Gynecology

## 2019-04-21 DIAGNOSIS — Z3A32 32 weeks gestation of pregnancy: Secondary | ICD-10-CM | POA: Insufficient documentation

## 2019-04-21 DIAGNOSIS — R102 Pelvic and perineal pain: Secondary | ICD-10-CM | POA: Diagnosis present

## 2019-04-21 DIAGNOSIS — M549 Dorsalgia, unspecified: Secondary | ICD-10-CM | POA: Insufficient documentation

## 2019-04-21 DIAGNOSIS — D259 Leiomyoma of uterus, unspecified: Secondary | ICD-10-CM | POA: Diagnosis not present

## 2019-04-21 DIAGNOSIS — O99891 Other specified diseases and conditions complicating pregnancy: Secondary | ICD-10-CM

## 2019-04-21 DIAGNOSIS — O26893 Other specified pregnancy related conditions, third trimester: Secondary | ICD-10-CM | POA: Diagnosis not present

## 2019-04-21 DIAGNOSIS — O24419 Gestational diabetes mellitus in pregnancy, unspecified control: Secondary | ICD-10-CM | POA: Diagnosis not present

## 2019-04-21 LAB — URINALYSIS, ROUTINE W REFLEX MICROSCOPIC
Bilirubin Urine: NEGATIVE
Glucose, UA: >=500 mg/dL — AB
Hgb urine dipstick: NEGATIVE
Ketones, ur: NEGATIVE mg/dL
Leukocytes,Ua: NEGATIVE
Nitrite: NEGATIVE
Protein, ur: NEGATIVE mg/dL
Specific Gravity, Urine: 1.008 (ref 1.005–1.030)
pH: 7 (ref 5.0–8.0)

## 2019-04-21 NOTE — MAU Note (Signed)
Pt stated she started feeling increased pelvic pressure and tightening around 6:45 tonight. Good fetal movement reported and denies any vag bleeding or leaking at this time. Also c/o some back pain on her right side.

## 2019-04-22 MED ORDER — CYCLOBENZAPRINE HCL 10 MG PO TABS: 10.0000 mg | ORAL_TABLET | Freq: Three times a day (TID) | ORAL | 0 refills | Status: DC | PRN

## 2019-04-22 MED ORDER — CYCLOBENZAPRINE HCL 5 MG PO TABS
10.0000 mg | ORAL_TABLET | Freq: Once | ORAL | Status: AC
  Administered 2019-04-22: 10 mg via ORAL
  Filled 2019-04-22: qty 2

## 2019-04-22 NOTE — Discharge Instructions (Signed)
Back Pain in Pregnancy Back pain during pregnancy is common. Back pain may be caused by several factors that are related to changes during your pregnancy. Follow these instructions at home: Managing pain, stiffness, and swelling      If directed, for sudden (acute) back pain, put ice on the painful area. ? Put ice in a plastic bag. ? Place a towel between your skin and the bag. ? Leave the ice on for 20 minutes, 2-3 times per day.  If directed, apply heat to the affected area before you exercise. Use the heat source that your health care provider recommends, such as a moist heat pack or a heating pad. ? Place a towel between your skin and the heat source. ? Leave the heat on for 20-30 minutes. ? Remove the heat if your skin turns bright red. This is especially important if you are unable to feel pain, heat, or cold. You may have a greater risk of getting burned.  If directed, massage the affected area. Activity  Exercise as told by your health care provider. Gentle exercise is the best way to prevent or manage back pain.  Listen to your body when lifting. If lifting hurts, ask for help or bend your knees. This uses your leg muscles instead of your back muscles.  Squat down when picking up something from the floor. Do not bend over.  Only use bed rest for short periods as told by your health care provider. Bed rest should only be used for the most severe episodes of back pain. Standing, sitting, and lying down  Do not stand in one place for long periods of time.  Use good posture when sitting. Make sure your head rests over your shoulders and is not hanging forward. Use a pillow on your lower back if necessary.  Try sleeping on your side, preferably the left side, with a pregnancy support pillow or 1-2 regular pillows between your legs. ? If you have back pain after a night's rest, your bed may be too soft. ? A firm mattress may provide more support for your back during  pregnancy. General instructions  Do not wear high heels.  Eat a healthy diet. Try to gain weight within your health care provider's recommendations.  Use a maternity girdle, elastic sling, or back brace as told by your health care provider.  Take over-the-counter and prescription medicines only as told by your health care provider.  Work with a physical therapist or massage therapist to find ways to manage back pain. Acupuncture or massage therapy may be helpful.  Keep all follow-up visits as told by your health care provider. This is important. Contact a health care provider if:  Your back pain interferes with your daily activities.  You have increasing pain in other parts of your body. Get help right away if:  You develop numbness, tingling, weakness, or problems with the use of your arms or legs.  You develop severe back pain that is not controlled with medicine.  You have a change in bowel or bladder control.  You develop shortness of breath, dizziness, or you faint.  You develop nausea, vomiting, or sweating.  You have back pain that is a rhythmic, cramping pain similar to labor pains. Labor pain is usually 1-2 minutes apart, lasts for about 1 minute, and involves a bearing down feeling or pressure in your pelvis.  You have back pain and your water breaks or you have vaginal bleeding.  You have back pain or  numbness that travels down your leg.  Your back pain developed after you fell.  You develop pain on one side of your back.  You see blood in your urine.  You develop skin blisters in the area of your back pain. Summary  Back pain may be caused by several factors that are related to changes during your pregnancy.  Follow instructions as told by your health care provider for managing pain, stiffness, and swelling.  Exercise as told by your health care provider. Gentle exercise is the best way to prevent or manage back pain.  Take over-the-counter and  prescription medicines only as told by your health care provider.  Keep all follow-up visits as told by your health care provider. This is important. This information is not intended to replace advice given to you by your health care provider. Make sure you discuss any questions you have with your health care provider. Document Revised: 05/21/2018 Document Reviewed: 07/18/2017 Elsevier Patient Education  [place] to back [place] support band

## 2019-04-22 NOTE — MAU Provider Note (Signed)
History     Chief Complaint  Patient presents with  . Contractions   29 yo G1P0 BF @ 32 2/[redacted] weeks gestation presents with c/o  Increased  pelvic pressure, low back pain and pain on right side. (+) FM. No vaginal bleeding. Hx uterine fibroids. Denies leakage of fluid. Back pain is unchanged as is right sided pain for which pt was evaluated  in the office on 3/5. PNC also complicated by Class A2 GDM. Pt did not take her pm glyburide as she has not eaten since lunch  OB History     Gravida  1   Para  0   Term  0   Preterm  0   AB  0   Living  0      SAB  0   TAB  0   Ectopic  0   Multiple  0   Live Births  0           Past Medical History:  Diagnosis Date  . Anemia   . Anxiety   . Fibroids   . Gestational diabetes   . Syncope     Past Surgical History:  Procedure Laterality Date  . NO PAST SURGERIES      Family History  Problem Relation Age of Onset  . Clotting disorder Mother   . Hypertension Mother   . Diabetes Paternal Grandmother     Social History   Tobacco Use  . Smoking status: Never Smoker  . Smokeless tobacco: Never Used  Substance Use Topics  . Alcohol use: Not Currently    Alcohol/week: 0.0 standard drinks    Comment: occ  . Drug use: No    Allergies:  Allergies  Allergen Reactions  . Morphine And Related Itching  . Xulane [Norelgestromin-Eth Estradiol] Dermatitis    Medications Prior to Admission  Medication Sig Dispense Refill Last Dose  . Ferrous Sulfate (IRON PO) Take 1 tablet by mouth daily.   04/21/2019 at Unknown time  . glyBURIDE (DIABETA) 5 MG tablet Take 5 mg by mouth 2 (two) times daily with a meal.   04/21/2019 at Unknown time  . Prenatal Vit-Fe Fumarate-FA (PRENATAL MULTIVITAMIN) TABS tablet Take 1 tablet by mouth daily at 12 noon.   04/20/2019 at Unknown time  . indomethacin (INDOCIN) 25 MG capsule Take 25 mg by mouth every 6 (six) hours.   More than a month at Unknown time  . promethazine (PHENERGAN) 25 MG tablet  Take 25 mg by mouth every 4 (four) hours as needed.   More than a month at Unknown time     Physical Exam   Blood pressure 109/64, pulse 97, temperature 98.4 F (36.9 C), resp. rate 18, height 5' 6.5" (1.689 m), last menstrual period 09/06/2018.  General appearance: alert, cooperative, and no distress Heart: regular rate and rhythm, S1, S2 normal, no murmur, click, rub or gallop Abdomen:  gravid nontender Pelvic: external genitalia normal and long/closed/OOP Extremities: no edema, redness or tenderness in the calves or thighs Back no CVAT Tracing: baseline 150 (+) accel to 170-180 UI ED Course  IMP: pelvic pressure in pregnancy Back pain in pregnancy Class A2 GDM IUP @ 32 + wk Fibroid uterus in pregnancy Right sided abdominal pain P)Pelvic sonogram r/o degeneration of fibroid again. U/a.  MDM   Marvene Staff, MD 12:02 AM 04/22/2019  Addendum:  US Pelvis Complete  Result Date: 04/21/2019 CLINICAL DATA:  Initial evaluation for acute right-sided pain. EXAM: TRANSABDOMINAL AND TRANSVAGINAL ULTRASOUND OF PELVIS  TECHNIQUE: Both transabdominal and transvaginal ultrasound examinations of the pelvis were performed. Transabdominal technique was performed for global imaging of the pelvis including uterus, ovaries, adnexal regions, and pelvic cul-de-sac. It was necessary to proceed with endovaginal exam following the transabdominal exam to visualize the uterus, endometrium, and ovaries. COMPARISON:  Prior MRI from 01/15/2019 FINDINGS: Uterus Gravid uterus, partially visualized, and incompletely assessed on this exam. Patient's known fibroid not well seen given current pregnancy status. Endometrium Not applicable, gravid uterus. Right ovary Measurements: 4.2 x 2.1 x 2.9 cm = volume: 13.3 mL. Normal appearance/no adnexal mass. Left ovary Measurements: 4.3 x 2.6 x 3.7 cm = volume: 21.6 mL. Normal appearance/no adnexal mass. Other findings No abnormal free fluid. IMPRESSION: 1. Nonvisualization  of patient's known fibroid secondary to current pregnancy status/gravid uterus. If there remains clinical indication and desire for evaluation of the patient's known fibroid, further assessment with dedicated pelvic MRI could be performed for further evaluation. Pregnancy and fetal status not assessed on this examination. 2. Normal sonographic appearance of both ovaries. No adnexal mass or free fluid. Electronically Signed   By: Jeannine Boga M.D.   On: 04/21/2019 23:29  u/a: (+) glucose  P)  d/c home. Disc use of glyburide and importance of eating. PTL prec. Keep hydration. May try flexeril for back pain. Pt agrees. Flexeril now.  Keep sched OB appt.

## 2019-05-29 ENCOUNTER — Other Ambulatory Visit: Payer: Self-pay

## 2019-05-29 ENCOUNTER — Telehealth (HOSPITAL_COMMUNITY): Payer: Self-pay | Admitting: *Deleted

## 2019-05-29 ENCOUNTER — Encounter (HOSPITAL_COMMUNITY): Payer: Self-pay | Admitting: *Deleted

## 2019-05-29 ENCOUNTER — Encounter (HOSPITAL_COMMUNITY): Payer: Self-pay | Admitting: Obstetrics

## 2019-05-29 ENCOUNTER — Inpatient Hospital Stay (EMERGENCY_DEPARTMENT_HOSPITAL)
Admission: AD | Admit: 2019-05-29 | Discharge: 2019-05-30 | Disposition: A | Discharge status: Home / Self Care | Payer: Managed Care, Other (non HMO) | Source: Ambulatory Visit | Attending: Obstetrics | Admitting: Obstetrics

## 2019-05-29 DIAGNOSIS — O479 False labor, unspecified: Secondary | ICD-10-CM

## 2019-05-29 DIAGNOSIS — O471 False labor at or after 37 completed weeks of gestation: Secondary | ICD-10-CM

## 2019-05-29 LAB — OB RESULTS CONSOLE GBS: GBS: POSITIVE

## 2019-05-29 NOTE — MAU Note (Signed)
Patient presents for CTX 4-5 minutes apart.  Denies LOF/VB.  States she had a cervical exam today in the office and her cervix was closed.

## 2019-05-29 NOTE — Telephone Encounter (Signed)
Preadmission screen  

## 2019-05-30 ENCOUNTER — Inpatient Hospital Stay (HOSPITAL_COMMUNITY): Payer: Managed Care, Other (non HMO) | Admitting: Anesthesiology

## 2019-05-30 ENCOUNTER — Inpatient Hospital Stay (HOSPITAL_COMMUNITY)
Admission: AD | Admit: 2019-05-30 | Discharge: 2019-06-01 | DRG: 805 | Disposition: A | Discharge status: Home / Self Care | Payer: Managed Care, Other (non HMO) | Attending: Obstetrics & Gynecology | Admitting: Obstetrics & Gynecology

## 2019-05-30 ENCOUNTER — Encounter (HOSPITAL_COMMUNITY): Payer: Self-pay | Admitting: Obstetrics

## 2019-05-30 ENCOUNTER — Other Ambulatory Visit: Payer: Self-pay

## 2019-05-30 DIAGNOSIS — O24424 Gestational diabetes mellitus in childbirth, insulin controlled: Principal | ICD-10-CM | POA: Diagnosis present

## 2019-05-30 DIAGNOSIS — Z20822 Contact with and (suspected) exposure to covid-19: Secondary | ICD-10-CM | POA: Diagnosis present

## 2019-05-30 DIAGNOSIS — O99214 Obesity complicating childbirth: Secondary | ICD-10-CM | POA: Diagnosis present

## 2019-05-30 DIAGNOSIS — O3413 Maternal care for benign tumor of corpus uteri, third trimester: Secondary | ICD-10-CM | POA: Diagnosis present

## 2019-05-30 DIAGNOSIS — O9902 Anemia complicating childbirth: Secondary | ICD-10-CM | POA: Diagnosis present

## 2019-05-30 DIAGNOSIS — O99824 Streptococcus B carrier state complicating childbirth: Secondary | ICD-10-CM | POA: Diagnosis present

## 2019-05-30 DIAGNOSIS — O471 False labor at or after 37 completed weeks of gestation: Secondary | ICD-10-CM | POA: Diagnosis not present

## 2019-05-30 DIAGNOSIS — O411230 Chorioamnionitis, third trimester, not applicable or unspecified: Secondary | ICD-10-CM | POA: Diagnosis present

## 2019-05-30 DIAGNOSIS — D649 Anemia, unspecified: Secondary | ICD-10-CM | POA: Diagnosis present

## 2019-05-30 DIAGNOSIS — O3663X0 Maternal care for excessive fetal growth, third trimester, not applicable or unspecified: Secondary | ICD-10-CM | POA: Diagnosis present

## 2019-05-30 DIAGNOSIS — R109 Unspecified abdominal pain: Secondary | ICD-10-CM

## 2019-05-30 DIAGNOSIS — Z3689 Encounter for other specified antenatal screening: Secondary | ICD-10-CM

## 2019-05-30 DIAGNOSIS — O169 Unspecified maternal hypertension, unspecified trimester: Principal | ICD-10-CM

## 2019-05-30 DIAGNOSIS — Z3A37 37 weeks gestation of pregnancy: Secondary | ICD-10-CM | POA: Diagnosis not present

## 2019-05-30 DIAGNOSIS — E669 Obesity, unspecified: Secondary | ICD-10-CM | POA: Diagnosis present

## 2019-05-30 DIAGNOSIS — O26892 Other specified pregnancy related conditions, second trimester: Secondary | ICD-10-CM

## 2019-05-30 DIAGNOSIS — D259 Leiomyoma of uterus, unspecified: Secondary | ICD-10-CM

## 2019-05-30 DIAGNOSIS — O26893 Other specified pregnancy related conditions, third trimester: Secondary | ICD-10-CM | POA: Diagnosis present

## 2019-05-30 LAB — COMPREHENSIVE METABOLIC PANEL
ALT: 29 U/L (ref 0–44)
AST: 26 U/L (ref 15–41)
Albumin: 3 g/dL — ABNORMAL LOW (ref 3.5–5.0)
Alkaline Phosphatase: 124 U/L (ref 38–126)
Anion gap: 12 (ref 5–15)
BUN: 6 mg/dL (ref 6–20)
CO2: 19 mmol/L — ABNORMAL LOW (ref 22–32)
Calcium: 9.1 mg/dL (ref 8.9–10.3)
Chloride: 104 mmol/L (ref 98–111)
Creatinine, Ser: 0.54 mg/dL (ref 0.44–1.00)
GFR calc Af Amer: >60 mL/min (ref >60)
GFR calc non Af Amer: >60 mL/min (ref >60)
Glucose, Bld: 113 mg/dL — ABNORMAL HIGH (ref 70–99)
Potassium: 3.9 mmol/L (ref 3.5–5.1)
Sodium: 135 mmol/L (ref 135–145)
Total Bilirubin: 0.3 mg/dL (ref 0.3–1.2)
Total Protein: 6.3 g/dL — ABNORMAL LOW (ref 6.5–8.1)

## 2019-05-30 LAB — GLUCOSE, CAPILLARY
Glucose-Capillary: 77 mg/dL (ref 70–99)
Glucose-Capillary: 77 mg/dL (ref 70–99)
Glucose-Capillary: 71 mg/dL (ref 70–99)
Glucose-Capillary: 89 mg/dL (ref 70–99)

## 2019-05-30 LAB — PROTEIN / CREATININE RATIO, URINE
Creatinine, Urine: 86.76 mg/dL
Protein Creatinine Ratio: 0.16 mg/mg{Cre} — ABNORMAL HIGH (ref 0.00–0.15)
Total Protein, Urine: 14 mg/dL

## 2019-05-30 LAB — CBC
HCT: 40.7 % (ref 36.0–46.0)
Hemoglobin: 13.1 g/dL (ref 12.0–15.0)
MCH: 26.1 pg (ref 26.0–34.0)
MCHC: 32.2 g/dL (ref 30.0–36.0)
MCV: 81.2 fL (ref 80.0–100.0)
Platelets: 285 10*3/uL (ref 150–400)
RBC: 5.01 MIL/uL (ref 3.87–5.11)
RDW: 17.9 % — ABNORMAL HIGH (ref 11.5–15.5)
WBC: 8.2 10*3/uL (ref 4.0–10.5)
nRBC: 0 % (ref 0.0–0.2)

## 2019-05-30 LAB — RESPIRATORY PANEL BY RT PCR (FLU A&B, COVID)
Influenza A by PCR: NEGATIVE
Influenza B by PCR: NEGATIVE
SARS Coronavirus 2 by RT PCR: NEGATIVE

## 2019-05-30 LAB — ABO/RH: ABO/RH(D): O POS

## 2019-05-30 LAB — TYPE AND SCREEN
ABO/RH(D): O POS
Antibody Screen: NEGATIVE

## 2019-05-30 MED ORDER — SODIUM CHLORIDE 0.9 % IV SOLN
5.0000 10*6.[IU] | Freq: Once | INTRAVENOUS | Status: AC
  Administered 2019-05-30: 5 10*6.[IU] via INTRAVENOUS
  Filled 2019-05-30: qty 5

## 2019-05-30 MED ORDER — ONDANSETRON HCL 4 MG/2ML IJ SOLN
4.0000 mg | Freq: Four times a day (QID) | INTRAMUSCULAR | Status: DC | PRN
  Administered 2019-05-30: 4 mg via INTRAVENOUS
  Filled 2019-05-30: qty 2

## 2019-05-30 MED ORDER — ACETAMINOPHEN 325 MG PO TABS
650.0000 mg | ORAL_TABLET | ORAL | Status: DC | PRN
  Administered 2019-05-30: 650 mg via ORAL
  Filled 2019-05-30 (×2): qty 2

## 2019-05-30 MED ORDER — ACETAMINOPHEN 325 MG PO TABS
325.0000 mg | ORAL_TABLET | Freq: Once | ORAL | Status: AC
  Administered 2019-05-30: 325 mg via ORAL

## 2019-05-30 MED ORDER — SODIUM CHLORIDE (PF) 0.9 % IJ SOLN
INTRAMUSCULAR | Status: DC | PRN
  Administered 2019-05-30: 12 mL/h via EPIDURAL

## 2019-05-30 MED ORDER — EPHEDRINE 5 MG/ML INJ
10.0000 mg | INTRAVENOUS | Status: DC | PRN
  Filled 2019-05-30: qty 2

## 2019-05-30 MED ORDER — SODIUM CHLORIDE 0.9 % IV SOLN
2.0000 g | Freq: Four times a day (QID) | INTRAVENOUS | Status: DC
  Administered 2019-05-30: 2 g via INTRAVENOUS
  Filled 2019-05-30 (×4): qty 2

## 2019-05-30 MED ORDER — PHENYLEPHRINE 40 MCG/ML (10ML) SYRINGE FOR IV PUSH (FOR BLOOD PRESSURE SUPPORT)
80.0000 ug | PREFILLED_SYRINGE | INTRAVENOUS | Status: DC | PRN
  Administered 2019-05-30: 80 ug via INTRAVENOUS
  Filled 2019-05-30: qty 10

## 2019-05-30 MED ORDER — OXYCODONE-ACETAMINOPHEN 5-325 MG PO TABS
1.0000 | ORAL_TABLET | Freq: Once | ORAL | Status: AC
  Administered 2019-05-30: 1 via ORAL
  Filled 2019-05-30: qty 1

## 2019-05-30 MED ORDER — OXYTOCIN BOLUS FROM INFUSION
500.0000 mL | Freq: Once | INTRAVENOUS | Status: AC
  Administered 2019-05-30: 500 mL via INTRAVENOUS

## 2019-05-30 MED ORDER — LACTATED RINGERS IV SOLN: 500.0000 mL | INTRAVENOUS | Status: DC | PRN

## 2019-05-30 MED ORDER — LIDOCAINE HCL (PF) 1 % IJ SOLN
30.0000 mL | INTRAMUSCULAR | Status: DC | PRN
  Administered 2019-05-30: 30 mL via SUBCUTANEOUS
  Filled 2019-05-30: qty 30

## 2019-05-30 MED ORDER — LACTATED RINGERS IV SOLN: INTRAVENOUS | Status: DC

## 2019-05-30 MED ORDER — LIDOCAINE HCL (PF) 1 % IJ SOLN
INTRAMUSCULAR | Status: DC | PRN
  Administered 2019-05-30 (×2): 4 mL via EPIDURAL

## 2019-05-30 MED ORDER — FENTANYL-BUPIVACAINE-NACL 0.5-0.125-0.9 MG/250ML-% EP SOLN
12.0000 mL/h | EPIDURAL | Status: DC | PRN
  Filled 2019-05-30: qty 250

## 2019-05-30 MED ORDER — LACTATED RINGERS IV SOLN: 500.0000 mL | Freq: Once | INTRAVENOUS | Status: DC

## 2019-05-30 MED ORDER — SOD CITRATE-CITRIC ACID 500-334 MG/5ML PO SOLN: 30.0000 mL | ORAL | Status: DC | PRN

## 2019-05-30 MED ORDER — PENICILLIN G POT IN DEXTROSE 60000 UNIT/ML IV SOLN
3.0000 10*6.[IU] | INTRAVENOUS | Status: DC
  Administered 2019-05-30: 3 10*6.[IU] via INTRAVENOUS
  Filled 2019-05-30: qty 50

## 2019-05-30 MED ORDER — DIPHENHYDRAMINE HCL 50 MG/ML IJ SOLN: 12.5000 mg | INTRAMUSCULAR | Status: DC | PRN

## 2019-05-30 MED ORDER — PHENYLEPHRINE 40 MCG/ML (10ML) SYRINGE FOR IV PUSH (FOR BLOOD PRESSURE SUPPORT)
80.0000 ug | PREFILLED_SYRINGE | INTRAVENOUS | Status: DC | PRN
  Filled 2019-05-30 (×2): qty 10

## 2019-05-30 MED ORDER — OXYTOCIN 40 UNITS IN NORMAL SALINE INFUSION - SIMPLE MED
2.5000 [IU]/h | INTRAVENOUS | Status: DC
  Filled 2019-05-30: qty 1000

## 2019-05-30 NOTE — Discharge Instructions (Signed)
Braxton Hicks Contractions °Contractions of the uterus can occur throughout pregnancy, but they are not always a sign that you are in labor. You may have practice contractions called Braxton Hicks contractions. These false labor contractions are sometimes confused with true labor. °What are Braxton Hicks contractions? °Braxton Hicks contractions are tightening movements that occur in the muscles of the uterus before labor. Unlike true labor contractions, these contractions do not result in opening (dilation) and thinning of the cervix. Toward the end of pregnancy (32-34 weeks), Braxton Hicks contractions can happen more often and may become stronger. These contractions are sometimes difficult to tell apart from true labor because they can be very uncomfortable. You should not feel embarrassed if you go to the hospital with false labor. °Sometimes, the only way to tell if you are in true labor is for your health care provider to look for changes in the cervix. The health care provider will do a physical exam and may monitor your contractions. If you are not in true labor, the exam should show that your cervix is not dilating and your water has not broken. °If there are no other health problems associated with your pregnancy, it is completely safe for you to be sent home with false labor. You may continue to have Braxton Hicks contractions until you go into true labor. °How to tell the difference between true labor and false labor °True labor °· Contractions last 30-70 seconds. °· Contractions become very regular. °· Discomfort is usually felt in the top of the uterus, and it spreads to the lower abdomen and low back. °· Contractions do not go away with walking. °· Contractions usually become more intense and increase in frequency. °· The cervix dilates and gets thinner. °False labor °· Contractions are usually shorter and not as strong as true labor contractions. °· Contractions are usually irregular. °· Contractions  are often felt in the front of the lower abdomen and in the groin. °· Contractions may go away when you walk around or change positions while lying down. °· Contractions get weaker and are shorter-lasting as time goes on. °· The cervix usually does not dilate or become thin. °Follow these instructions at home: ° °· Take over-the-counter and prescription medicines only as told by your health care provider. °· Keep up with your usual exercises and follow other instructions from your health care provider. °· Eat and drink lightly if you think you are going into labor. °· If Braxton Hicks contractions are making you uncomfortable: °? Change your position from lying down or resting to walking, or change from walking to resting. °? Sit and rest in a tub of warm water. °? Drink enough fluid to keep your urine pale yellow. Dehydration may cause these contractions. °? Do slow and deep breathing several times an hour. °· Keep all follow-up prenatal visits as told by your health care provider. This is important. °Contact a health care provider if: °· You have a fever. °· You have continuous pain in your abdomen. °Get help right away if: °· Your contractions become stronger, more regular, and closer together. °· You have fluid leaking or gushing from your vagina. °· You pass blood-tinged mucus (bloody show). °· You have bleeding from your vagina. °· You have low back pain that you never had before. °· You feel your baby’s head pushing down and causing pelvic pressure. °· Your baby is not moving inside you as much as it used to. °Summary °· Contractions that occur before labor are   called Braxton Hicks contractions, false labor, or practice contractions. °· Braxton Hicks contractions are usually shorter, weaker, farther apart, and less regular than true labor contractions. True labor contractions usually become progressively stronger and regular, and they become more frequent. °· Manage discomfort from Braxton Hicks contractions  by changing position, resting in a warm bath, drinking plenty of water, or practicing deep breathing. °This information is not intended to replace advice given to you by your health care provider. Make sure you discuss any questions you have with your health care provider. °Document Revised: 01/12/2017 Document Reviewed: 06/15/2016 °Elsevier Patient Education © 2020 Elsevier Inc. ° °

## 2019-05-30 NOTE — Anesthesia Procedure Notes (Signed)
Epidural Patient location during procedure: OB Start time: 05/30/2019 12:41 PM End time: 05/30/2019 12:50 PM  Staffing Anesthesiologist: Josephine Igo, MD Performed: anesthesiologist   Preanesthetic Checklist Completed: patient identified, IV checked, site marked, risks and benefits discussed, surgical consent, monitors and equipment checked, pre-op evaluation and timeout performed  Epidural Patient position: sitting Prep: DuraPrep and site prepped and draped Patient monitoring: continuous pulse ox and blood pressure Approach: midline Location: L3-L4 Injection technique: LOR air  Needle:  Needle type: Tuohy  Needle gauge: 17 G Needle length: 9 cm and 9 Needle insertion depth: 7 cm Catheter type: closed end flexible Catheter size: 19 Gauge Catheter at skin depth: 12 cm Test dose: negative and Other  Assessment Events: blood not aspirated, injection not painful, no injection resistance, no paresthesia and negative IV test  Additional Notes Patient identified. Risks and benefits discussed including failed block, incomplete  Pain control, post dural puncture headache, nerve damage, paralysis, blood pressure Changes, nausea, vomiting, reactions to medications-both toxic and allergic and post Partum back pain. All questions were answered. Patient expressed understanding and wished to proceed. Sterile technique was used throughout procedure. Epidural site was Dressed with sterile barrier dressing. No paresthesias, signs of intravascular injection Or signs of intrathecal spread were encountered.  Patient was more comfortable after the epidural was dosed. Please see RN's note for documentation of vital signs and FHR which are stable. Reason for block:procedure for pain

## 2019-05-30 NOTE — Anesthesia Preprocedure Evaluation (Signed)
Anesthesia Evaluation  Patient identified by MRN, date of birth, ID band Patient awake    Reviewed: Allergy & Precautions, Patient's Chart, lab work & pertinent test results  Airway Mallampati: II  TM Distance: >3 FB Neck ROM: Full    Dental no notable dental hx. (+) Teeth Intact   Pulmonary neg pulmonary ROS,    Pulmonary exam normal breath sounds clear to auscultation       Cardiovascular negative cardio ROS Normal cardiovascular exam Rhythm:Regular Rate:Normal     Neuro/Psych Anxiety negative neurological ROS     GI/Hepatic Neg liver ROS, GERD  ,  Endo/Other  diabetes, Well Controlled, Gestational, Insulin DependentObesity  Renal/GU negative Renal ROS  negative genitourinary   Musculoskeletal negative musculoskeletal ROS (+)   Abdominal (+) + obese,   Peds  Hematology  (+) anemia ,   Anesthesia Other Findings   Reproductive/Obstetrics                             Anesthesia Physical Anesthesia Plan  ASA: II  Anesthesia Plan: Epidural   Post-op Pain Management:    Induction: Intravenous  PONV Risk Score and Plan: Treatment may vary due to age or medical condition  Airway Management Planned: Natural Airway  Additional Equipment:   Intra-op Plan:   Post-operative Plan:   Informed Consent: I have reviewed the patients History and Physical, chart, labs and discussed the procedure including the risks, benefits and alternatives for the proposed anesthesia with the patient or authorized representative who has indicated his/her understanding and acceptance.       Plan Discussed with: Anesthesiologist  Anesthesia Plan Comments:         Anesthesia Quick Evaluation

## 2019-05-30 NOTE — MAU Note (Signed)
Patient came into MAU at [redacted]w[redacted]d gestation with c/o contractions. Patient reports being here in MAU at 3am this morning. Denies any LOF, denies any vaginal bleeding, feels fetal movement.

## 2019-05-30 NOTE — MAU Provider Note (Signed)
First Provider Initiated Contact with Patient 05/30/19 1032      S Ms. Kelli Cobb is a 29 y.o. G1P0000 pregnant female at [redacted]w[redacted]d who presents to MAU today with complaint of contractions. Provider notified by RN as patient having elevated pressures, but elevated pressures noted to be during contractions.  Pt denies HA, blurry vision/seeing spots, vomiting, epigastric pain, swelling in face and hands, sudden weight gain. Pt denies chest pain and SOB. Pt does endorse nausea that started this morning.  O BP 127/79   Pulse 97   Temp 98.1 F (36.7 C) (Oral)   Resp 20   LMP 09/06/2018 (Approximate)   SpO2 100%    Patient Vitals for the past 24 hrs:  BP Temp Temp src Pulse Resp SpO2  05/30/19 1116 127/79 -- -- 97 -- --  05/30/19 1101 131/75 -- -- 96 -- --  05/30/19 1047 127/83 -- -- (!) 101 -- --  05/30/19 1031 (!) 154/88 -- -- (!) 102 -- --  05/30/19 1015 (!) 147/89 -- -- 95 -- 100 %  05/30/19 1001 (!) 142/83 -- -- 100 -- --  05/30/19 1000 -- -- -- -- -- 99 %  05/30/19 0953 138/88 -- -- (!) 107 -- --  05/30/19 0945 (!) 144/91 -- -- 98 -- 100 %  05/30/19 0944 -- 98.1 F (36.7 C) Oral -- 20 --   Physical Exam  Constitutional: She is oriented to person, place, and time. She appears well-developed and well-nourished. No distress.  HENT:  Head: Normocephalic and atraumatic.  Respiratory: Effort normal.  Neurological: She is alert and oriented to person, place, and time.  Skin: She is not diaphoretic.  Psychiatric: She has a normal mood and affect. Her behavior is normal. Judgment and thought content normal.   A Pregnant female Medical screening exam complete  Preeclampsia labs ordered, though unlikely based on timing of elevated pressures with contractions Patient made cervical change and will be admitted to L&D EFM: reactive       -baseline: 140       -variability: moderate       -accels: present, 15x15       -decels: absent       -TOCO: q1-93min  P Admit to L&D for  delivery  Kyrstyn Greear, Gerrie Nordmann, NP 05/30/2019 11:22 AM

## 2019-05-30 NOTE — Progress Notes (Addendum)
Patient ID: Kelli Cobb, female   DOB: 10-16-1990, 29 y.o.   MRN: [id number] Complete and pushing.  Temp 100.7, Tylenol now, IV fluid bolus 300 cc. AROM after 2nd PCN at 4.45 pm today, clear fluid.  FHT unchanged 140s-150s + accels no decels mod variability Toco q 3 min, spontaneous labor  Side way pushing to aid rotation and effort better too.

## 2019-05-30 NOTE — H&P (Signed)
Kelli Cobb is a 29 y.o. G1P0000 at [redacted]w[redacted]d presenting for active labor. Pt notes onset contractions last night for which she came to MAU.  Patient may very slight change in MAU last night at home.  Today she arrived pressures dilated and has progressed to 5-1/2 cm dilated.. Good fetal movement, No vaginal bleeding, not leaking fluid.  [facility] at [facility] since 6 wks -Dated by LMP consistent with 6-week ultrasound -Insulin-dependent diabetes, followed by endocrinology, developed in the third trimester -Obesity with 55 pound weight gain -Anemia on iron -Several fibroids largest right lateral 8 x 8 cm.  Patient did have significant pain and fibroid degeneration in the mid second trimester.  Patient was given Indocin and then oxycodone for pain control -Fetal growth.  Ultrasound 37 weeks estimated 7 pounds 9 ounces, 89th percentile with AC at the [place] percentile   Prenatal Transfer Tool  Maternal Diabetes: Yes:  Diabetes Type:  Insulin/Medication controlled Genetic Screening: Normal Maternal Ultrasounds/Referrals: Normal Fetal Ultrasounds or other Referrals:  None Maternal Substance Abuse:  No Significant Maternal Medications:  None Significant Maternal Lab Results: Group B Strep positive     OB History    Gravida  1   Para  0   Term  0   Preterm  0   AB  0   Living  0     SAB  0   TAB  0   Ectopic  0   Multiple  0   Live Births  0          Past Medical History:  Diagnosis Date  . Anemia   . Anxiety   . External hemorrhoids   . Fibroid   . Fibroids   . Gestational diabetes   . Syncope    Past Surgical History:  Procedure Laterality Date  . NO PAST SURGERIES     Family History: family history includes Clotting disorder in her mother; Colon cancer in her paternal grandfather; Diabetes in her paternal grandmother; Hypertension in her maternal grandfather, maternal grandmother, mother, paternal grandfather, and paternal grandmother. Social History:   reports that she has never smoked. She has never used smokeless tobacco. She reports previous alcohol use. She reports that she does not use drugs.  Review of Systems - Negative except Painful contractions   Dilation: 5.5 Effacement (%): 90 Station: 0 Exam by:: A. Gagliardo, RN Blood pressure 127/79, pulse 97, temperature 98.1 F (36.7 C), temperature source Oral, resp. rate 20, last menstrual period 09/06/2018, SpO2 100 %.  PE Vitals:   05/30/19 1101 05/30/19 1116  BP: 131/75 127/79  Pulse: 96 97  Resp:    Temp:    SpO2:       Prenatal labs: ABO, Rh: --/--/O POS Performed at [facility], [address] [place], [place] [address]  [phone]) Antibody: Negative (09/24 0000) Rubella: Immune (09/24 0000) RPR: Nonreactive (09/24 0000)  HBsAg: Negative (09/24 0000)  HIV: Non-reactive (09/24 0000)  GBS: Positive/-- (04/15 0000)  1 hr Glucola normal with subsequent development of diabetes  Genetic screening normal maternity 21, normal AFP Anatomy US normal   Assessment/Plan: 29 y.o. G1P0000 at [redacted]w[redacted]d Active labor at term.  Expectant management GBS positive.  Start penicillin Okay for epidural Elevated BPs x2 while in maternity admissions.  Labs for preeclampsia have been sent and pending.  Subsequent blood pressures within normal range.  Will watch closely   Ala Dach 05/30/2019, 11:21 AM

## 2019-05-30 NOTE — MAU Provider Note (Signed)
S: Ms. Kelli Cobb is a 29 y.o. G1P0000 at [redacted]w[redacted]d  who presents to MAU today for labor evaluation.     Cervical exam by RN:  Dilation: 3 Effacement (%): 80 Cervical Position: Middle Station: -3 Presentation: Vertex Exam by:: Esau Grew, RN  Gave her 1.5 hours and recheck was unchanged Gave her a second 1.5 hours and recheck was unchanged Will give one tab Percocet prior to d/c per request.  Listed under allergies "itching" but tolerated it well in January 2021  Fetal Monitoring: Baseline: 140 Variability: average Accelerations: present Decelerations: absent Contractions: q 2-4 min  MDM Discussed patient with RN. NST reviewed.   A: SIUP at [redacted]w[redacted]d  False labor  P: Discharge home Labor precautions and kick counts included in AVS Patient to follow-up with office as scheduled  Patient may return to MAU as needed or when in labor   Seabron Spates, [place] 05/30/2019 2:39 AM

## 2019-05-30 NOTE — Progress Notes (Signed)
Kelli Cobb is a 29 y.o. G1P0000 at [redacted]w[redacted]d by ultrasound admitted for active labor at 37.5 wks.  Subjective: Feels well with epidural   Objective: BP (!) 95/33   Pulse (!) 110   Temp 98.3 F (36.8 C) (Oral)   Resp 18   Ht 5' 5.5" (1.664 m)   Wt 104.5 kg   LMP 09/06/2018 (Approximate)   SpO2 100%   BMI 37.76 kg/m  No intake/output data recorded. No intake/output data recorded.  FHT:  FHR: 130 bpm, variability: moderate,  accelerations:  Present,  decelerations:  Absent UC:   regular, every 3-4 minutes SVE:   Dilation: 5.5 Effacement (%): 80 Station: 0 Exam by:: Wess Botts RNC Repeat exam by MD planned at 2nd PCN dose for AROM   Labs: Lab Results  Component Value Date   WBC 8.2 05/30/2019   HGB 13.1 05/30/2019   HCT 40.7 05/30/2019   MCV 81.2 05/30/2019   PLT 285 05/30/2019    Assessment / Plan: Spontaneous labor, progressing normally, 37.5 wks, G1, A2GDM on Insulin since Glyburide didn't control well. Last Insulin dose last night.  BS q 4 hrs nl.  A2GDM and 55 lb wt gain. LGA, with recent EFW at 37 wks, 7\' 9" , 89th percentile, AC 98th percentile. Prepare room for shoulder dystocia, patient counseled  Several fibroids largest right lateral 8 x 8 cm.  I/D:  GBS+, 2nd PCN at 4.30 pm.  Anticipated MOD:  watch progress closely, esp descent , working towards SVD.   Kelli Cobb 05/30/2019, 3:26 PM

## 2019-05-31 ENCOUNTER — Encounter (HOSPITAL_COMMUNITY): Payer: Self-pay | Admitting: Obstetrics & Gynecology

## 2019-05-31 LAB — CBC
HCT: 37.3 % (ref 36.0–46.0)
Hemoglobin: 12.2 g/dL (ref 12.0–15.0)
MCH: 26.7 pg (ref 26.0–34.0)
MCHC: 32.7 g/dL (ref 30.0–36.0)
MCV: 81.6 fL (ref 80.0–100.0)
Platelets: 258 10*3/uL (ref 150–400)
RBC: 4.57 MIL/uL (ref 3.87–5.11)
RDW: 18.2 % — ABNORMAL HIGH (ref 11.5–15.5)
WBC: 9.7 10*3/uL (ref 4.0–10.5)
nRBC: 0 % (ref 0.0–0.2)

## 2019-05-31 LAB — RPR: RPR Ser Ql: NONREACTIVE

## 2019-05-31 MED ORDER — WITCH HAZEL-GLYCERIN EX PADS
1.0000 "application " | MEDICATED_PAD | CUTANEOUS | Status: DC | PRN
  Administered 2019-05-31 – 2019-06-01 (×2): 1 via TOPICAL

## 2019-05-31 MED ORDER — SIMETHICONE 80 MG PO CHEW
80.0000 mg | CHEWABLE_TABLET | ORAL | Status: DC | PRN
  Administered 2019-05-31 (×2): 80 mg via ORAL
  Filled 2019-05-31 (×2): qty 1

## 2019-05-31 MED ORDER — ONDANSETRON HCL 4 MG PO TABS: 4.0000 mg | ORAL_TABLET | ORAL | Status: DC | PRN

## 2019-05-31 MED ORDER — DIPHENHYDRAMINE HCL 25 MG PO CAPS: 25.0000 mg | ORAL_CAPSULE | Freq: Four times a day (QID) | ORAL | Status: DC | PRN

## 2019-05-31 MED ORDER — ACETAMINOPHEN 325 MG PO TABS
650.0000 mg | ORAL_TABLET | ORAL | Status: DC | PRN
  Administered 2019-05-31 (×2): 650 mg via ORAL
  Filled 2019-05-31 (×2): qty 2

## 2019-05-31 MED ORDER — BENZOCAINE-MENTHOL 20-0.5 % EX AERO
1.0000 "application " | INHALATION_SPRAY | CUTANEOUS | Status: DC | PRN
  Administered 2019-05-31: 1 via TOPICAL

## 2019-05-31 MED ORDER — TETANUS-DIPHTH-ACELL PERTUSSIS 5-2.5-18.5 LF-MCG/0.5 IM SUSP: 0.5000 mL | Freq: Once | INTRAMUSCULAR | Status: DC

## 2019-05-31 MED ORDER — DIBUCAINE (PERIANAL) 1 % EX OINT
1.0000 "application " | TOPICAL_OINTMENT | CUTANEOUS | Status: DC | PRN
  Filled 2019-05-31: qty 28

## 2019-05-31 MED ORDER — ONDANSETRON HCL 4 MG/2ML IJ SOLN: 4.0000 mg | INTRAMUSCULAR | Status: DC | PRN

## 2019-05-31 MED ORDER — PRENATAL MULTIVITAMIN CH
1.0000 | ORAL_TABLET | Freq: Every day | ORAL | Status: DC
  Administered 2019-05-31 – 2019-06-01 (×2): 1 via ORAL
  Filled 2019-05-31 (×2): qty 1

## 2019-05-31 MED ORDER — ZOLPIDEM TARTRATE 5 MG PO TABS: 5.0000 mg | ORAL_TABLET | Freq: Every evening | ORAL | Status: DC | PRN

## 2019-05-31 MED ORDER — IBUPROFEN 600 MG PO TABS
600.0000 mg | ORAL_TABLET | Freq: Four times a day (QID) | ORAL | Status: DC
  Administered 2019-05-31 – 2019-06-01 (×6): 600 mg via ORAL
  Filled 2019-05-31 (×8): qty 1

## 2019-05-31 MED ORDER — SENNOSIDES-DOCUSATE SODIUM 8.6-50 MG PO TABS
2.0000 | ORAL_TABLET | ORAL | Status: DC
  Administered 2019-05-31 (×2): 2 via ORAL
  Filled 2019-05-31 (×2): qty 2

## 2019-05-31 MED ORDER — COCONUT OIL OIL: 1.0000 "application " | TOPICAL_OIL | Status: DC | PRN

## 2019-05-31 NOTE — Progress Notes (Addendum)
MOB was referred for history of depression/anxiety. * Referral screened out by Clinical Social Worker because none of the following criteria appear to apply: ~ History of anxiety/depression during this pregnancy, or of post-partum depression following prior delivery. ~ Diagnosis of anxiety and/or depression within last 3 years; no concerns noted in OB records.  OR * MOB's symptoms currently being treated with medication and/or therapy.  Please contact the Clinical Social Worker if needs arise, by [facility] request, or if MOB scores greater than 9/yes to question 10 on Edinburgh Postpartum Depression Screen.  Laurey Arrow, MSW, LCSW Clinical Social Work [phone]

## 2019-05-31 NOTE — Lactation Note (Signed)
This note was copied from a baby's chart. Lactation Consultation Note  Patient Name: Kelli Cobb [id number] Date: 05/31/2019  P1, 7 hour ETI female infant. LC entered room, per mom,  she prefers to be seen in the morning by [facility] services, she is very tired and wants to latch infant at breast tomorrow morning after she is rested,  she made two attempts earlier and infant would not latch. Mom has been set up with DEBP and she pumped once.    Maternal Data    Feeding Feeding Type: Formula  LATCH Score Latch: Too sleepy or reluctant, no latch achieved, no sucking elicited.(only able to hand express a drop)  Audible Swallowing: None  Type of Nipple: Everted at rest and after stimulation  Comfort (Breast/Nipple): Soft / non-tender  Hold (Positioning): Full assist, staff holds infant at breast  LATCH Score: 4  Interventions    Lactation Tools Discussed/Used Tools: Pump Breast pump type: Double-Electric Breast Pump   Consult Status      Vicente Serene 05/31/2019, 5:32 AM

## 2019-05-31 NOTE — Lactation Note (Signed)
This note was copied from a baby's chart. Lactation Consultation Note  Patient Name: Kelli Cobb [id number] Date: 05/31/2019 Reason for consult: 1st time breastfeeding;Mother's request;Early term 37-38.6wks;Infant weight loss P1, 24 hour ETI female infant, -1% weight loss. Per mom, infant is starting to latch better and is being supplemented with donor breast milk. Mom latched infant on left breast using the football hold, infant latched with nose and chin touching breast, swallows observed and infant breastfed for 15 minutes. Mom hand expressed afterwards and infant was given 2 mls of mom's EBM by spoon and 10 mls of donor milk by foley cup. Mom is using DEBP afterwards, mom is pumping every 3 hours for 15 minutes on initial setting and will give infant back any EBM first before offering donor breast milk. Parents will continue to do STS as much as possible. Mom knows to breastfeed infant according to hunger cues, 8 to 12 times on demand and not exceed 3 hours without breastfeeding infant. Mom knows to call RN or  [facility] services if she has questions, concerns or needs assistance with latching infant at breast.   Maternal Data    Feeding    LATCH Score Latch: Grasps breast easily, tongue down, lips flanged, rhythmical sucking.  Audible Swallowing: Spontaneous and intermittent  Type of Nipple: Everted at rest and after stimulation  Comfort (Breast/Nipple): Soft / non-tender  Hold (Positioning): Assistance needed to correctly position infant at breast and maintain latch.  LATCH Score: 9  Interventions Interventions: Skin to skin;Assisted with latch;Adjust position;Breast compression;Support pillows;Position options;Expressed milk;DEBP;Hand express  Lactation Tools Discussed/Used     Consult Status Consult Status: Follow-up Date: 06/01/19 Follow-up type: In-patient    Vicente Serene 05/31/2019, 10:10 PM

## 2019-05-31 NOTE — Anesthesia Postprocedure Evaluation (Signed)
Anesthesia Post Note  Patient: Kelli Cobb  Procedure(s) Performed: AN AD [place]     Patient location during evaluation: Mother Baby Anesthesia Type: Epidural Level of consciousness: awake and alert, oriented and patient cooperative Pain management: pain level controlled Vital Signs Assessment: post-procedure vital signs reviewed and stable Respiratory status: spontaneous breathing Cardiovascular status: stable Postop Assessment: no headache, epidural receding, patient able to bend at knees and no signs of nausea or vomiting Anesthetic complications: no Comments: Pt. States she is walking.  Pain score 3.     Last Vitals:  Vitals:   05/31/19 1007 05/31/19 1455  BP: 120/87 125/74  Pulse: 97 86  Resp: 18 18  Temp: 36.9 C 36.7 C  SpO2: 99% 99%    Last Pain:  Vitals:   05/31/19 1753  TempSrc:   PainSc: 5    Pain Goal: Patients Stated Pain Goal: 4 (05/30/19 1228)                 Rico Sheehan

## 2019-05-31 NOTE — Lactation Note (Signed)
This note was copied from a baby's chart. Lactation Consultation Note  Patient Name: Kelli Cobb [id number] Date: 05/31/2019 Reason for consult: Initial assessment;Early term 37-38.6wks;1st time breastfeeding;Difficult latch  LC Student entered room and baby was in arms of FOB and RN assisting FOB with providing DM through syringe. MOB in bed, awake.   [place] asked MOB how feeding has been going. MOB reports no history of breastfeeding, affirms breast changes in pregnancy included darkened areola.  RN ended feeding due to baby sleeping.  [place] educated re: DM storage time, warming instructions.  MOB affirms that she has a [employer] DEBP at home.  FOB inquired about weight loss, LC student provided education re: baby belly size, normal newborn behavior, cluster feeding, supply and demand, and reviewed hunger cues and instructions to feed on demand.   [employer] education provided. MOB and FOB know to call for Lactation support, PRN.    Feeding Plan  1. Nipple stimulation, q3 hours. 2. Feed baby in accordance with hunger cues.    Maternal Data Has patient been taught Hand Expression?: Yes Does the patient have breastfeeding experience prior to this delivery?: No  Feeding Feeding Type: Donor Breast Milk  LATCH Score Latch: Too sleepy or reluctant, no latch achieved, no sucking elicited.  Audible Swallowing: None  Type of Nipple: Everted at rest and after stimulation  Comfort (Breast/Nipple): Soft / non-tender  Hold (Positioning): No assistance needed to correctly position infant at breast.(positioned baby well in football on left breast; tummy to mommy;nose to nipple)  LATCH Score: 6  Interventions Interventions: Breast feeding basics reviewed;Assisted with latch;Support pillows;Hand express;Reverse pressure;DEBP  Lactation Tools Discussed/Used Pump Review: Setup, frequency, and cleaning   Consult Status Consult Status: Follow-up Date: 06/01/19 Follow-up type:  In-patient    Olin Hauser 05/31/2019, 2:06 PM

## 2019-05-31 NOTE — Progress Notes (Signed)
PPD#1, active labor admit, 37.5 wks, SVD, 2nd degree perineal lac A2GDM Fever in labor, presume chorioamnionitis, S/p Tylenol  BOY, circ planned before d/c  Subjective: no complaints, up ad lib, voiding and tolerating PO Has some gas pain and distension and appropriate perineal pain  Objective: Blood pressure 120/82, pulse 85, temperature 98.3 F (36.8 C), temperature source Oral, resp. rate 18, height 5' 5.5" (1.664 m), weight 104.5 kg, last menstrual period 09/06/2018, SpO2 100 %, unknown if currently breastfeeding.  Physical Exam:  General: alert and cooperative  CV RRR Lungs CTA  Lochia: appropriate Uterine Fundus: firm Incision: healing well, no hematoma, well approximated  DVT Evaluation: No evidence of DVT seen on physical exam.  CBC Latest Ref Rng & Units 05/31/2019 05/30/2019 01/14/2019  WBC 4.0 - 10.5 K/uL 9.7 8.2 9.5  Hemoglobin 12.0 - 15.0 g/dL 12.2 13.1 11.2(L)  Hematocrit 36.0 - 46.0 % 37.3 40.7 34.5(L)  Platelets 150 - 400 K/uL 258 285 319   CBG (last 3)  Recent Labs    05/30/19 1500 05/30/19 1814 05/30/19 2228  GLUCAP 77 71 89   Assessment/Plan: PPD #1 SVD. PP care and Pericare discussed  A2GDM, continue to eat well. Plan 2hr GTT after 6 wks, stop Insulin/ Glyburide Breast feeding support   BOY- stable BS. Circ plan tomorrow. Peds aware of maternal fever towards the end in labor    LOS: 1 day   [name] 05/31/2019, 9:54 AM

## 2019-05-31 NOTE — Plan of Care (Signed)
  Problem: Clinical Measurements: Goal: Ability to maintain clinical measurements within normal limits will improve Outcome: Completed/Met Goal: Will remain free from infection Outcome: Completed/Met Goal: Diagnostic test results will improve Outcome: Completed/Met Goal: Respiratory complications will improve Outcome: Completed/Met Goal: Cardiovascular complication will be avoided Outcome: Completed/Met   Problem: Activity: Goal: Risk for activity intolerance will decrease Outcome: Completed/Met   Problem: Nutrition: Goal: Adequate nutrition will be maintained Outcome: Completed/Met   Problem: Elimination: Goal: Will not experience complications related to bowel motility Outcome: Completed/Met   Problem: Safety: Goal: Ability to remain free from injury will improve Outcome: Completed/Met   Problem: Skin Integrity: Goal: Risk for impaired skin integrity will decrease Outcome: Completed/Met   Problem: Education: Goal: Knowledge of General Education information will improve Description: Including pain rating scale, medication(s)/side effects and non-pharmacologic comfort measures Outcome: Completed/Met   Problem: Clinical Measurements: Goal: Ability to maintain clinical measurements within normal limits will improve Outcome: Completed/Met Goal: Will remain free from infection Outcome: Completed/Met Goal: Diagnostic test results will improve Outcome: Completed/Met Goal: Respiratory complications will improve Outcome: Completed/Met Goal: Cardiovascular complication will be avoided Outcome: Completed/Met   Problem: Pain Managment: Goal: General experience of comfort will improve Outcome: Completed/Met   Problem: Safety: Goal: Ability to remain free from injury will improve Outcome: Completed/Met

## 2019-06-01 MED ORDER — ACETAMINOPHEN 325 MG PO TABS: 650.0000 mg | ORAL_TABLET | ORAL | Status: DC | PRN

## 2019-06-01 MED ORDER — IBUPROFEN 600 MG PO TABS: 600.0000 mg | ORAL_TABLET | Freq: Four times a day (QID) | ORAL | 0 refills | Status: DC

## 2019-06-01 NOTE — Lactation Note (Signed)
This note was copied from a baby's chart. Lactation Consultation Note  Patient Name: Kelli Cobb [id number] Date: 06/01/2019 Reason for consult: Follow-up assessment  P1 mother whose infant is now 15 hours old.  This is an ETI at 37+5 weeks with a 6% weight loss.    Mother has been having some difficulty with latching, feeding and keeping baby awake.  She has used donor breast milk and  Is now using formula for supplementation.  Mother was pumping when I arrived.  She has only been able to see a couple of colostrum drops so far.  Encouraged her to feed back any EBM she obtains to baby.    Discussed a revised feeding plan for today/tonight:  Suggested mother continue to feed on cue, however, if baby does not self awaken by three hours mother will awaken him.  At times he has had longer than three hour interval between feedings.  Mother will spend 5-10 minutes attempting to latch and , if she has difficulty, call her RN/LC for assistance. Discussed supplementation and volume guidelines with parents.  They will increase the amount they have been supplementing to at least 20-30 mls/feeding.  They will change from using a foley cup to a bottle for supplementation.  Mother prefers to use Similac now instead of donor milk.  She asked about mixing her EBM with formula and I discouraged this and provided reasons why.  Mother will feed her EBM first and supplement with Similac after feeding.  Father will do the supplementing while mother pumps with the DEBP.  Parents in agreement with plan.  To be sure there is complete understanding and follow through with this plan, I offered to return at the next feed to assist and review the plan.  Parents appreciative and I will return at 0930 unless baby shows cues prior to this time.  RN updated.  Discussed with RN to wait until tomorrow for circumcision.  Saw OB after speaking with RN and she agreed to wait until tomorrow for circumcision to enable baby to practice  breast feeding skills.       Maternal Data    Feeding Feeding Type: Breast Fed  LATCH Score                   Interventions    Lactation Tools Discussed/Used     Consult Status Consult Status: Follow-up Date: 06/02/19 Follow-up type: In-patient    Aamna Mallozzi R Eber Ferrufino 06/01/2019, 9:00 AM

## 2019-06-01 NOTE — Lactation Note (Signed)
This note was copied from a baby's chart. Lactation Consultation Note  Patient Name: Kelli Cobb [id number] Date: 06/01/2019 Reason for consult: Follow-up assessment;Early term 37-38.6wks;Primapara;1st time breastfeeding  P1 mother whose infant is now 20 hours old.  This is an ETI  At 37+5 weeks.    RN had assisted with latching prior to my arrival.  Randel Books was not very interested in feeding at this time.  Father was supplementing with Similac20 when I arrived.  As discussed in my eariler note I have returned to formulate and finalize the feeding plan for today and tonight with the parents.    Considerable amount of time spent reviewing breast feeding basics and answering parents' questions.  Both parents are very receptive to learning.  Mother will feed 8-12 times/24 hours or sooner if baby shows feeding cues.  She will awaken baby at the three hour interval if he has not self awakened.  Asked mother to call her RN/LC for latch assistance as needed.  She will spend no more than 5-10 minutes attempting to latch.  If baby is not interested, father will begin with the supplementation while mother uses the DEBP for 15 minutes.    Asked permission to teach father paced bottle feeding and father receptive.  Baby is using the gold slow flow nipple and paced nicely.  Demonstrated effective burping and swaddling.  Baby consumed 26 mls easily and fell asleep after feeding.    Reviewed the pump, pump parts, flange size and positioning and milk collection and storage with mother.  Mother was excited to see more colostrum drops at this pumping session.  She will save it and feed back to baby prior to the next feeding.  Reviewed LPTI policy supplementation guidelines with parents.  Educated them on increasing the volumes according to the baby's age.  Reminded them that baby is allowed to have more if desired and to increase volume amounts by 5 mls at a time with effective burping during/after feedings.  Parents  verbalized understanding.  Parents will call for further questions/concerns.  RN updated.   Maternal Data Formula Feeding for Exclusion: No Has patient been taught Hand Expression?: Yes Does the patient have breastfeeding experience prior to this delivery?: No  Feeding Feeding Type: Formula  LATCH Score                   Interventions    Lactation Tools Discussed/Used Pump Review: Setup, frequency, and cleaning;Milk Storage(Reviewed) Initiated by:: Aurther Harlin   Consult Status Consult Status: Follow-up Date: 06/02/19 Follow-up type: In-patient    Rigby Swamy R Marilynn Ekstein 06/01/2019, 10:14 AM

## 2019-06-01 NOTE — Discharge Summary (Signed)
Obstetric Discharge Summary Reason for Admission: onset of labor, 37.5 wks, A2 GDM, in insulin. GBS+ Prenatal Procedures: NST and ultrasound Intrapartum Procedures: spontaneous vaginal delivery and GBS prophylaxis.One dose of Cefoxitin for fever of 100.3 in labor in 2nd stage  Postpartum Procedures: none Complications-Operative and Postpartum: 2nd degree perineal laceration Hemoglobin  Date Value Ref Range Status  05/31/2019 12.2 12.0 - 15.0 g/dL Final   HCT  Date Value Ref Range Status  05/31/2019 37.3 36.0 - 46.0 % Final    Physical Exam:  General: alert and cooperative Lochia: appropriate Uterine Fundus: firm Incision: no significant drainage, no dehiscence DVT Evaluation: Negative Homan's sign.  Discharge Diagnoses: Term Pregnancy-delivered  Discharge Information: Date: 06/01/2019 Activity: pelvic rest Diet: routine Medications: PNV and Ibuprofen Condition: stable Instructions: refer to practice specific booklet Discharge to: home Follow-up Information    Servando Salina, MD Follow up in 6 week(s).   Specialty: Obstetrics and Gynecology Contact information: [address] [place] [address] [phone]           Newborn Data: Live born female  Birth Weight: 8 lb 5.9 oz (3796 g) APGAR: 22, 9  Newborn Delivery   Birth date/time: 05/30/2019 21:53:00 Delivery type: Vaginal, Spontaneous      Home with mother but not discharged yet, He needs to stay for feeding and wt loss issues. So circumcision will be done prior to discharge. Rondel Oh Avonelle Viveros 06/01/2019, 3:12 PM

## 2019-06-01 NOTE — Progress Notes (Signed)
PPD# 2, active labor admit, 37.5 wks, SVD, 2nd degree perineal lac A2GDM Fever in labor >36 hrs back, presume chorioamnionitis, S/p Tylenol  BOY, circ planned but on HOLD due to poor feeding. Baby needs to stay.   Subjective: no complaints, up ad lib, voiding and tolerating PO  Objective: Blood pressure 123/88, pulse 80, temperature 98.1 F (36.7 C), temperature source Oral, resp. rate 18, height 5' 5.5" (1.664 m), weight 104.5 kg, last menstrual period 09/06/2018, SpO2 99 %, unknown if currently breastfeeding.  Physical Exam:  General: alert and cooperative  CV RRR Lungs CTA  Lochia: appropriate Uterine Fundus: firm Incision: healing well, no hematoma, well approximated  DVT Evaluation: No evidence of DVT seen on physical exam.  CBC Latest Ref Rng & Units 05/31/2019 05/30/2019 01/14/2019  WBC 4.0 - 10.5 K/uL 9.7 8.2 9.5  Hemoglobin 12.0 - 15.0 g/dL 12.2 13.1 11.2(L)  Hematocrit 36.0 - 46.0 % 37.3 40.7 34.5(L)  Platelets 150 - 400 K/uL 258 285 319   CBG (last 3)  Recent Labs    05/30/19 1500 05/30/19 1814 05/30/19 2228  GLUCAP 77 71 89   Assessment/Plan: PPD # 2 SVD. PP care and Pericare discussed. Discharge home. Might be able to stay as baby patient. A2GDM, continue to eat well. Plan 2hr GTT after 6 wks, stop Insulin/ Glyburide Breast feeding support,  BOY-stable VS, with mom, Circ planned when baby is ready   LOS: 2 days   [name] 06/01/2019, 8:06 AM

## 2019-06-02 ENCOUNTER — Ambulatory Visit: Payer: Self-pay

## 2019-06-02 NOTE — Lactation Note (Signed)
This note was copied from a baby's chart. Lactation Consultation Note  Patient Name: Kelli Cobb [id number] Date: 06/02/2019 Reason for consult: Follow-up assessment;Early term 74-38.6wks   Baby 58 hours old.  Mother happy to have pumped 25 ml this morning which she gave to infant recently and he is now sleeping. She is first putting baby to the breast and then giving BM and formula if needed. Mother states last night baby latched and sustained for approx 30 min. Encouraged her to continue breastfeeding and pumping. She has personal DEBP. Reviewed engorgement care and monitoring voids/stools. Feed on demand with cues.  Goal 8-12+ times per day after first 24 hrs.  Place baby STS if not cueing.     Maternal Data    Feeding Feeding Type: Breast Milk  LATCH Score                   Interventions Interventions: Breast feeding basics reviewed;DEBP  Lactation Tools Discussed/Used Breast pump type: Double-Electric Breast Pump   Consult Status Consult Status: Complete Date: 06/02/19    Vivianne [facility] 06/02/2019, 8:17 AM

## 2019-06-06 ENCOUNTER — Other Ambulatory Visit (HOSPITAL_COMMUNITY): Payer: Managed Care, Other (non HMO)

## 2019-06-08 ENCOUNTER — Inpatient Hospital Stay (HOSPITAL_COMMUNITY): Payer: Managed Care, Other (non HMO)

## 2019-06-08 ENCOUNTER — Inpatient Hospital Stay (HOSPITAL_COMMUNITY)
Admission: AD | Admit: 2019-06-08 | Payer: Managed Care, Other (non HMO) | Source: Home / Self Care | Admitting: Obstetrics and Gynecology

## 2020-09-14 ENCOUNTER — Emergency Department (HOSPITAL_COMMUNITY): Payer: Managed Care, Other (non HMO)

## 2020-09-14 ENCOUNTER — Encounter (HOSPITAL_COMMUNITY): Payer: Self-pay

## 2020-09-14 ENCOUNTER — Other Ambulatory Visit: Payer: Self-pay

## 2020-09-14 ENCOUNTER — Emergency Department (HOSPITAL_COMMUNITY)
Admission: EM | Admit: 2020-09-14 | Discharge: 2020-09-14 | Disposition: A | Discharge status: Home / Self Care | Payer: Managed Care, Other (non HMO) | Attending: Emergency Medicine | Admitting: Emergency Medicine

## 2020-09-14 DIAGNOSIS — R197 Diarrhea, unspecified: Secondary | ICD-10-CM | POA: Insufficient documentation

## 2020-09-14 DIAGNOSIS — N9489 Other specified conditions associated with female genital organs and menstrual cycle: Secondary | ICD-10-CM | POA: Insufficient documentation

## 2020-09-14 DIAGNOSIS — R140 Abdominal distension (gaseous): Secondary | ICD-10-CM | POA: Insufficient documentation

## 2020-09-14 DIAGNOSIS — R1030 Lower abdominal pain, unspecified: Secondary | ICD-10-CM | POA: Diagnosis not present

## 2020-09-14 DIAGNOSIS — R110 Nausea: Secondary | ICD-10-CM | POA: Insufficient documentation

## 2020-09-14 LAB — COMPREHENSIVE METABOLIC PANEL
ALT: 19 U/L (ref 0–44)
AST: 15 U/L (ref 15–41)
Albumin: 4 g/dL (ref 3.5–5.0)
Alkaline Phosphatase: 53 U/L (ref 38–126)
Anion gap: 6 (ref 5–15)
BUN: 7 mg/dL (ref 6–20)
CO2: 25 mmol/L (ref 22–32)
Calcium: 9 mg/dL (ref 8.9–10.3)
Chloride: 103 mmol/L (ref 98–111)
Creatinine, Ser: 0.53 mg/dL (ref 0.44–1.00)
GFR, Estimated: >60 mL/min (ref >60)
Glucose, Bld: 110 mg/dL — ABNORMAL HIGH (ref 70–99)
Potassium: 4.3 mmol/L (ref 3.5–5.1)
Sodium: 134 mmol/L — ABNORMAL LOW (ref 135–145)
Total Bilirubin: 0.4 mg/dL (ref 0.3–1.2)
Total Protein: 7.7 g/dL (ref 6.5–8.1)

## 2020-09-14 LAB — URINALYSIS, ROUTINE W REFLEX MICROSCOPIC
Bacteria, UA: NONE SEEN
Bilirubin Urine: NEGATIVE
Glucose, UA: NEGATIVE mg/dL
Hgb urine dipstick: NEGATIVE
Ketones, ur: NEGATIVE mg/dL
Nitrite: NEGATIVE
Protein, ur: NEGATIVE mg/dL
Specific Gravity, Urine: >1.046 — ABNORMAL HIGH (ref 1.005–1.030)
pH: 7 (ref 5.0–8.0)

## 2020-09-14 LAB — CBC
HCT: 43.6 % (ref 36.0–46.0)
Hemoglobin: 13.7 g/dL (ref 12.0–15.0)
MCH: 25.7 pg — ABNORMAL LOW (ref 26.0–34.0)
MCHC: 31.4 g/dL (ref 30.0–36.0)
MCV: 81.6 fL (ref 80.0–100.0)
Platelets: 363 10*3/uL (ref 150–400)
RBC: 5.34 MIL/uL — ABNORMAL HIGH (ref 3.87–5.11)
RDW: 14.4 % (ref 11.5–15.5)
WBC: 7.4 10*3/uL (ref 4.0–10.5)
nRBC: 0 % (ref 0.0–0.2)

## 2020-09-14 LAB — LIPASE, BLOOD: Lipase: 28 U/L (ref 11–51)

## 2020-09-14 LAB — I-STAT BETA HCG BLOOD, ED (MC, WL, AP ONLY): I-stat hCG, quantitative: <5 m[IU]/mL (ref <5)

## 2020-09-14 MED ORDER — ONDANSETRON 4 MG PO TBDP: 4.0000 mg | ORAL_TABLET | Freq: Three times a day (TID) | ORAL | 0 refills | Status: DC | PRN

## 2020-09-14 MED ORDER — IOHEXOL 350 MG/ML SOLN
100.0000 mL | Freq: Once | INTRAVENOUS | Status: AC | PRN
  Administered 2020-09-14: 80 mL via INTRAVENOUS

## 2020-09-14 MED ORDER — SODIUM CHLORIDE 0.9 % IV BOLUS
1000.0000 mL | Freq: Once | INTRAVENOUS | Status: AC
Start: 2020-09-14 — End: 2020-09-14
  Administered 2020-09-14: 1000 mL via INTRAVENOUS

## 2020-09-14 MED ORDER — ONDANSETRON HCL 4 MG/2ML IJ SOLN
4.0000 mg | Freq: Once | INTRAMUSCULAR | Status: AC
Start: 2020-09-14 — End: 2020-09-14
  Administered 2020-09-14: 4 mg via INTRAVENOUS
  Filled 2020-09-14: qty 2

## 2020-09-14 NOTE — ED Triage Notes (Signed)
Patient c/o mid abdominal pain R>L and nausea since yesterday. Patient went to [facility] today and was sent to the ED for further evaluation.

## 2020-09-14 NOTE — ED Provider Notes (Signed)
[facility] DEPT Provider Note   CSN: [id number] Arrival date & time: 09/14/20  1130     History Chief Complaint  Patient presents with   Abdominal Pain   Nausea    Kelli Cobb is a 30 y.o. female with a past medical history significant for diabetes and uterine fibroids who presents to the ED due to lower abdominal pain x1 day.  Patient evaluated at urgent care prior to arrival and sent to the ED to rule out acute abdomen.  Abdominal pain associated with nausea and diarrhea.  Patient states she has been "straining" to have bowel movements for the past 24 hours; however, continues to have looser stool.  She has tried Pepto-Bismol with no relief.  Admits to chills however, no documented fever.  Patient also states her abdomen feels more distended than normal.  No previous abdominal operations.  Patient had a vaginal delivery roughly 15 months ago.  No melena, hematemesis, or hematochezia.  No URI symptoms.  Denies vaginal and urinary symptoms.  Patient is currently sexually active with her husband with no concern for STDs.  No treatment prior to arrival.  No aggravating or alleviating symptoms.  History obtained from patient and past medical records. No interpreter used during encounter.      Past Medical History:  Diagnosis Date   Anemia    Anxiety    External hemorrhoids    Fibroid    Fibroids    Gestational diabetes    Syncope     Patient Active Problem List   Diagnosis Date Noted   SVD (spontaneous vaginal delivery) 05/31/2019   Indication for care in labor and delivery, antepartum 05/30/2019   Abdominal pain during pregnancy in second trimester 02/15/2019   Uterine fibroids affecting pregnancy in second trimester 02/15/2019   Chest pain 01/08/2016    Past Surgical History:  Procedure Laterality Date   NO PAST SURGERIES       OB History     Gravida  1   Para  1   Term  1   Preterm  0   AB  0   Living  1      SAB  0    IAB  0   Ectopic  0   Multiple  0   Live Births  1           Family History  Problem Relation Age of Onset   Clotting disorder Mother    Hypertension Mother    Diabetes Paternal Grandmother    Hypertension Paternal Grandmother    Hypertension Maternal Grandmother    Hypertension Maternal Grandfather    Colon cancer Paternal Grandfather    Hypertension Paternal Grandfather     Social History   Tobacco Use   Smoking status: Never   Smokeless tobacco: Never  Vaping Use   Vaping Use: Never used  Substance Use Topics   Alcohol use: Not Currently    Alcohol/week: 0.0 standard drinks    Comment: occ   Drug use: No    Home Medications Prior to Admission medications   Medication Sig Start Date End Date Taking? Authorizing Provider  ferrous sulfate 325 (65 FE) MG tablet Take 325 mg by mouth daily with breakfast.   Yes [provider]  ibuprofen (ADVIL) 600 MG tablet Take 1 tablet (600 mg total) by mouth every 6 (six) hours. Patient taking differently: Take 600 mg by mouth every 6 (six) hours as needed for mild pain. 06/01/19  Yes Azucena Fallen,  MD  metFORMIN (GLUCOPHAGE-XR) 500 MG 24 hr tablet Take 500 mg by mouth daily with breakfast. 07/08/20  Yes [provider]  Prenatal Vit-Fe Fumarate-FA (PRENATAL MULTIVITAMIN) TABS tablet Take 1 tablet by mouth daily at 12 noon.   Yes [provider]  acetaminophen (TYLENOL) 325 MG tablet Take 2 tablets (650 mg total) by mouth every 4 (four) hours as needed (for pain scale < 4). Patient not taking: No sig reported 06/01/19   Azucena Fallen, MD    Allergies    Morphine and related and Marilu Favre [norelgestromin-eth estradiol]  Review of Systems   Review of Systems  Constitutional:  Positive for chills. Negative for fever.  Respiratory:  Negative for shortness of breath.   Cardiovascular:  Negative for chest pain.  Gastrointestinal:  Positive for abdominal pain, constipation, diarrhea and nausea. Negative for  vomiting.  Genitourinary:  Negative for dysuria and vaginal discharge.  All other systems reviewed and are negative.  Physical Exam Updated Vital Signs BP (!) 129/91 (BP Location: Left Arm)   Pulse 88   Temp 98 F (36.7 C) (Oral)   Resp 18   Ht '5\' 7"'$  (1.702 m)   Wt 98.9 kg   LMP 08/18/2020 (Approximate)   SpO2 98%   BMI 34.14 kg/m   Physical Exam Vitals and nursing note reviewed.  Constitutional:      General: She is not in acute distress.    Appearance: She is not ill-appearing.  HENT:     Head: Normocephalic.  Eyes:     Pupils: Pupils are equal, round, and reactive to light.  Cardiovascular:     Rate and Rhythm: Normal rate and regular rhythm.     Pulses: Normal pulses.     Heart sounds: Normal heart sounds. No murmur heard.   No friction rub. No gallop.  Pulmonary:     Effort: Pulmonary effort is normal.     Breath sounds: Normal breath sounds.  Abdominal:     General: Abdomen is flat. There is no distension.     Palpations: Abdomen is soft.     Tenderness: There is no abdominal tenderness. There is no guarding or rebound.     Comments: Lower abdominal tenderness R>L with guarding.   Musculoskeletal:        General: Normal range of motion.     Cervical back: Neck supple.  Skin:    General: Skin is warm and dry.  Neurological:     General: No focal deficit present.     Mental Status: She is alert.  Psychiatric:        Mood and Affect: Mood normal.        Behavior: Behavior normal.    ED Results / Procedures / Treatments   Labs (all labs ordered are listed, but only abnormal results are displayed) Labs Reviewed  COMPREHENSIVE METABOLIC PANEL - Abnormal; Notable for the following components:      Result Value   Sodium 134 (*)    Glucose, Bld 110 (*)    All other components within normal limits  CBC - Abnormal; Notable for the following components:   RBC 5.34 (*)    MCH 25.7 (*)    All other components within normal limits  URINALYSIS, ROUTINE W REFLEX  MICROSCOPIC - Abnormal; Notable for the following components:   Color, Urine STRAW (*)    Specific Gravity, Urine >1.046 (*)    Leukocytes,Ua SMALL (*)    All other components within normal limits  URINE CULTURE  LIPASE,  BLOOD  I-STAT BETA HCG BLOOD, ED ([facility], [facility], [facility] ONLY)    EKG None  Radiology US Transvaginal Non-OB  Result Date: 09/14/2020 CLINICAL DATA:  Pelvic pain. EXAM: TRANSABDOMINAL AND TRANSVAGINAL ULTRASOUND OF PELVIS DOPPLER ULTRASOUND OF OVARIES TECHNIQUE: Both transabdominal and transvaginal ultrasound examinations of the pelvis were performed. Transabdominal technique was performed for global imaging of the pelvis including uterus, ovaries, adnexal regions, and pelvic cul-de-sac. It was necessary to proceed with endovaginal exam following the transabdominal exam to visualize the uterus, ovaries, and adnexa. Color and duplex Doppler ultrasound was utilized to evaluate blood flow to the ovaries. COMPARISON:  CT of earlier today. FINDINGS: Uterus Measurements: 9.5 x 4.1 x 5.9 cm = volume: 121 mL. No fibroids or other mass visualized. Endometrium Thickness: Normal, 12 mm. Minimal fluid within is likely physiologic. Right ovary Measurements: 2.7 x 1.2 x 2.3 cm = volume: 3.6 mL. Normal appearance/no adnexal mass. Left ovary Measurements: 4.0 x 2.7 x 3.2 cm = volume: 18 0.2 mL. 2.6 x 2.3 x 2.3 cm corpus luteal cyst within, including on image 67. Pulsed Doppler evaluation of both ovaries demonstrates normal low-resistance arterial and venous waveforms. Other findings Trace free pelvic fluid is likely physiologic. IMPRESSION: Left ovarian corpus luteal cyst. Trace cul-de-sac fluid which is likely physiologic or related to cyst rupture. Electronically Signed   By: Abigail Miyamoto M.D.   On: 09/14/2020 18:50   US Pelvis Complete  Result Date: 09/14/2020 CLINICAL DATA:  Pelvic pain. EXAM: TRANSABDOMINAL AND TRANSVAGINAL ULTRASOUND OF PELVIS DOPPLER ULTRASOUND OF OVARIES TECHNIQUE: Both transabdominal  and transvaginal ultrasound examinations of the pelvis were performed. Transabdominal technique was performed for global imaging of the pelvis including uterus, ovaries, adnexal regions, and pelvic cul-de-sac. It was necessary to proceed with endovaginal exam following the transabdominal exam to visualize the uterus, ovaries, and adnexa. Color and duplex Doppler ultrasound was utilized to evaluate blood flow to the ovaries. COMPARISON:  CT of earlier today. FINDINGS: Uterus Measurements: 9.5 x 4.1 x 5.9 cm = volume: 121 mL. No fibroids or other mass visualized. Endometrium Thickness: Normal, 12 mm. Minimal fluid within is likely physiologic. Right ovary Measurements: 2.7 x 1.2 x 2.3 cm = volume: 3.6 mL. Normal appearance/no adnexal mass. Left ovary Measurements: 4.0 x 2.7 x 3.2 cm = volume: 18 0.2 mL. 2.6 x 2.3 x 2.3 cm corpus luteal cyst within, including on image 67. Pulsed Doppler evaluation of both ovaries demonstrates normal low-resistance arterial and venous waveforms. Other findings Trace free pelvic fluid is likely physiologic. IMPRESSION: Left ovarian corpus luteal cyst. Trace cul-de-sac fluid which is likely physiologic or related to cyst rupture. Electronically Signed   By: Abigail Miyamoto M.D.   On: 09/14/2020 18:50   CT ABDOMEN PELVIS W CONTRAST  Result Date: 09/14/2020 CLINICAL DATA:  Right lower quadrant abdominal pain. Appendicitis suspected. Nausea and vomiting. EXAM: CT ABDOMEN AND PELVIS WITH CONTRAST TECHNIQUE: Multidetector CT imaging of the abdomen and pelvis was performed using the standard protocol following bolus administration of intravenous contrast. CONTRAST:  35m OMNIPAQUE IOHEXOL 350 MG/ML SOLN COMPARISON:  MRI 01/15/2019 FINDINGS: Lower chest: Negative Hepatobiliary: Normal Pancreas: Normal Spleen: Normal Adrenals/Urinary Tract: Adrenal glands are normal. Kidneys are normal. No cyst, mass, stone or hydronephrosis. Bladder is normal. Stomach/Bowel: Stomach and small intestine are  normal. The appendix is normal. No inflammatory change, appendicoliths or dilatation. No colon abnormality otherwise. Vascular/Lymphatic: Normal Reproductive: Normal. No pelvic mass. There does appear to be a small leiomyoma exophytic from the uterus to the right, measuring  only 2 cm. Previously seen large exophytic leiomyoma has apparently been resected. Other: No free fluid or air. Musculoskeletal: Normal IMPRESSION: Negative study. Normal appearing appendix. No abnormality seen to explain right lower quadrant pain. Electronically Signed   By: Nelson Chimes M.D.   On: 09/14/2020 17:33   [place] Art/Ven Flow Abd Pelv Doppler  Result Date: 09/14/2020 CLINICAL DATA:  Pelvic pain. EXAM: TRANSABDOMINAL AND TRANSVAGINAL ULTRASOUND OF PELVIS DOPPLER ULTRASOUND OF OVARIES TECHNIQUE: Both transabdominal and transvaginal ultrasound examinations of the pelvis were performed. Transabdominal technique was performed for global imaging of the pelvis including uterus, ovaries, adnexal regions, and pelvic cul-de-sac. It was necessary to proceed with endovaginal exam following the transabdominal exam to visualize the uterus, ovaries, and adnexa. Color and duplex Doppler ultrasound was utilized to evaluate blood flow to the ovaries. COMPARISON:  CT of earlier today. FINDINGS: Uterus Measurements: 9.5 x 4.1 x 5.9 cm = volume: 121 mL. No fibroids or other mass visualized. Endometrium Thickness: Normal, 12 mm. Minimal fluid within is likely physiologic. Right ovary Measurements: 2.7 x 1.2 x 2.3 cm = volume: 3.6 mL. Normal appearance/no adnexal mass. Left ovary Measurements: 4.0 x 2.7 x 3.2 cm = volume: 18 0.2 mL. 2.6 x 2.3 x 2.3 cm corpus luteal cyst within, including on image 67. Pulsed Doppler evaluation of both ovaries demonstrates normal low-resistance arterial and venous waveforms. Other findings Trace free pelvic fluid is likely physiologic. IMPRESSION: Left ovarian corpus luteal cyst. Trace cul-de-sac fluid which is likely  physiologic or related to cyst rupture. Electronically Signed   By: Abigail Miyamoto M.D.   On: 09/14/2020 18:50    Procedures Procedures   Medications Ordered in ED Medications  sodium chloride 0.9 % bolus 1,000 mL (0 mLs Intravenous Stopped 09/14/20 1931)  ondansetron (ZOFRAN) injection 4 mg (4 mg Intravenous Given 09/14/20 1630)  iohexol (OMNIPAQUE) 350 MG/ML injection 100 mL (80 mLs Intravenous Contrast Given 09/14/20 1655)    ED Course  I have reviewed the triage vital signs and the nursing notes.  Pertinent labs & imaging results that were available during my care of the patient were reviewed by me and considered in my medical decision making (see chart for details).  Clinical Course as of 09/14/20 1955  Tue Sep 14, 2020  1610 Sodium(!): 134 [CA]  1610 Glucose(!): 110 [CA]  1610 I-stat hCG, quantitative: <5.0 [CA]  [address]): SMALL [CA]    Clinical Course User Index [CA] Suzy Bouchard, PA-C   MDM Rules/Calculators/A&P                          30 year old female presents to the ED due to bilateral lower abdominal pain x1 day associated nausea and diarrhea.  Patient also endorses abdominal distention.  No previous abdominal operations.  Patient evaluated urgent care prior to arrival and sent to the ED to rule out acute abdomen.  Upon arrival, vitals all within normal limits.  Patient is afebrile, not tachycardic or hypoxic.  Patient in no acute distress.  Abdomen soft, nondistended with bilateral lower abdominal tenderness with guarding.  Right greater than left.  Routine labs ordered at triage.  CT abdomen to rule out appendicitis versus other intra-abdominal etiologies.  No vaginal symptoms. No concern for STDs per patient. IV fluids and Zofran given.  CBC reassuring with no leukocytosis and normal hemoglobin.  CMP significant for mild natremia at 134 and hyperglycemia 110.  No anion gap.  Normal renal function.  Lipase normal at  28. Pregnancy test negative. CT abdomen  personally reviewed which is negative for any acute abnormalities.  No evidence of acute appendicitis. Low suspicion for acute abdomen. Ultrasound ordered to rule out pelvic etiology.  [place] personally reviewed which demonstrates: IMPRESSION:  Left ovarian corpus luteal cyst. Trace cul-de-sac fluid which is  likely physiologic or related to cyst rupture.   Patient able to tolerate p.o. at bedside.  Patient discharged with Zofran as needed for nausea.  Advised patient to follow-up with OB/GYN in regards to ovarian cyst. Strict ED precautions discussed with patient. Patient states understanding and agrees to plan. Patient discharged home in no acute distress and stable vitals Final Clinical Impression(s) / ED Diagnoses Final diagnoses:  Lower abdominal pain    Rx / DC Orders ED Discharge Orders     None        Karie Kirks 09/14/20 1955    Tegeler, Gwenyth Allegra, MD 09/15/20 [phone]

## 2020-09-14 NOTE — ED Provider Notes (Signed)
Emergency Medicine Provider Triage Evaluation Note  Kelli Cobb , a 30 y.o. female  was evaluated in triage.  Pt complains of abdominal pain that started last night around 11 PM.  Patient states her abdominal pain is along the central abdomen diffusely but worst on the right.  Reports associated nausea/vomiting as well as increase in the frequency of her bowel movements.  Denies diarrhea.  States that she was evaluated at urgent care and sent to the emergency department for further evaluation.  Denies any URI symptoms.  Physical Exam  BP 134/90 (BP Location: Right Arm)   Pulse 90   Temp 98.7 F (37.1 C) (Oral)   Resp 16   Ht '5\' 7"'$  (1.702 m)   Wt 98.9 kg   LMP 08/18/2020 (Approximate)   SpO2 99%   BMI 34.14 kg/m  Gen:   Awake, no distress   Resp:  Normal effort  MSK:   Moves extremities without difficulty  Other:    Medical Decision Making  Medically screening exam initiated at 12:10 PM.  Appropriate orders placed.  Harini Willner was informed that the remainder of the evaluation will be completed by another provider, this initial triage assessment does not replace that evaluation, and the importance of remaining in the ED until their evaluation is complete.   Rayna Sexton, PA-C 09/14/20 1213    Regan Lemming, MD 09/15/20 [phone]

## 2020-09-14 NOTE — ED Notes (Signed)
Patient transported to CT 

## 2020-09-14 NOTE — Discharge Instructions (Addendum)
It was a pleasure taking care of you today.  As discussed, your CT abdomen did not show any acute abnormalities.  Your appendix was normal.  Your ultrasound showed a left ovarian cyst.  Please follow-up with OB/GYN for further evaluation.  I am sending you home with nausea medication.  Take as needed for nausea.  You may take over-the-counter ibuprofen or Tylenol as needed for abdominal pain.  The abdominal pain medication I was originally going to give you is not allowed during breastfeeding.  Please follow-up with PCP within the next week for further evaluation.  Return to the ER for new or worsening symptoms.  Always discuss with OBGYN any medications prior to starting due to breastfeeding.

## 2020-09-16 LAB — URINE CULTURE

## 2020-11-08 ENCOUNTER — Other Ambulatory Visit: Payer: Self-pay

## 2020-11-08 ENCOUNTER — Encounter (HOSPITAL_COMMUNITY): Payer: Self-pay

## 2020-11-08 ENCOUNTER — Emergency Department (HOSPITAL_COMMUNITY)
Admission: EM | Admit: 2020-11-08 | Discharge: 2020-11-08 | Disposition: A | Discharge status: Home / Self Care | Payer: Managed Care, Other (non HMO) | Attending: Emergency Medicine | Admitting: Emergency Medicine

## 2020-11-08 DIAGNOSIS — L0291 Cutaneous abscess, unspecified: Secondary | ICD-10-CM

## 2020-11-08 DIAGNOSIS — L02412 Cutaneous abscess of left axilla: Secondary | ICD-10-CM | POA: Insufficient documentation

## 2020-11-08 DIAGNOSIS — Z7984 Long term (current) use of oral hypoglycemic drugs: Secondary | ICD-10-CM | POA: Insufficient documentation

## 2020-11-08 LAB — PREGNANCY, URINE: Preg Test, Ur: NEGATIVE

## 2020-11-08 MED ORDER — LIDOCAINE-EPINEPHRINE (PF) 2 %-1:200000 IJ SOLN
10.0000 mL | Freq: Once | INTRAMUSCULAR | Status: AC
  Administered 2020-11-08: 10 mL
  Filled 2020-11-08: qty 20

## 2020-11-08 MED ORDER — ACETAMINOPHEN 325 MG PO TABS
650.0000 mg | ORAL_TABLET | Freq: Once | ORAL | Status: AC
  Administered 2020-11-08: 650 mg via ORAL
  Filled 2020-11-08: qty 2

## 2020-11-08 MED ORDER — DOXYCYCLINE HYCLATE 100 MG PO CAPS: 100.0000 mg | ORAL_CAPSULE | Freq: Two times a day (BID) | ORAL | 0 refills | Status: AC

## 2020-11-08 NOTE — ED Provider Notes (Signed)
[facility] DEPT Provider Note   CSN: [id number] Arrival date & time: 11/08/20  1638     History Chief Complaint  Patient presents with   Abscess    Abscess L. under arm    Kelli Cobb is a 30 y.o. female.  HPI Patient is a 30 year old female with a medical history as noted below.  She presents to the emergency department due to pain and swelling along the left axillary region.  Symptoms started about 3 days ago and have been worsening.  No history of similar symptoms.  Denies fevers, chills, nausea, vomiting.    Past Medical History:  Diagnosis Date   Anemia    Anxiety    External hemorrhoids    Fibroid    Fibroids    Gestational diabetes    Syncope     Patient Active Problem List   Diagnosis Date Noted   SVD (spontaneous vaginal delivery) 05/31/2019   Indication for care in labor and delivery, antepartum 05/30/2019   Abdominal pain during pregnancy in second trimester 02/15/2019   Uterine fibroids affecting pregnancy in second trimester 02/15/2019   Chest pain 01/08/2016    Past Surgical History:  Procedure Laterality Date   NO PAST SURGERIES       OB History     Gravida  1   Para  1   Term  1   Preterm  0   AB  0   Living  1      SAB  0   IAB  0   Ectopic  0   Multiple  0   Live Births  1           Family History  Problem Relation Age of Onset   Clotting disorder Mother    Hypertension Mother    Diabetes Paternal Grandmother    Hypertension Paternal Grandmother    Hypertension Maternal Grandmother    Hypertension Maternal Grandfather    Colon cancer Paternal Grandfather    Hypertension Paternal Grandfather     Social History   Tobacco Use   Smoking status: Never   Smokeless tobacco: Never  Vaping Use   Vaping Use: Never used  Substance Use Topics   Alcohol use: Not Currently    Alcohol/week: 0.0 standard drinks    Comment: occ   Drug use: No    Home Medications Prior to  Admission medications   Medication Sig Start Date End Date Taking? Authorizing Provider  doxycycline (VIBRAMYCIN) 100 MG capsule Take 1 capsule (100 mg total) by mouth 2 (two) times daily for 7 days. 11/08/20 11/15/20 Yes Rayna Sexton, PA-C  acetaminophen (TYLENOL) 325 MG tablet Take 2 tablets (650 mg total) by mouth every 4 (four) hours as needed (for pain scale < 4). Patient not taking: No sig reported 06/01/19   Azucena Fallen, MD  ferrous sulfate 325 (65 FE) MG tablet Take 325 mg by mouth daily with breakfast.    [provider]  ibuprofen (ADVIL) 600 MG tablet Take 1 tablet (600 mg total) by mouth every 6 (six) hours. Patient taking differently: Take 600 mg by mouth every 6 (six) hours as needed for mild pain. 06/01/19   Azucena Fallen, MD  metFORMIN (GLUCOPHAGE-XR) 500 MG 24 hr tablet Take 500 mg by mouth daily with breakfast. 07/08/20   [provider]  ondansetron (ZOFRAN ODT) 4 MG disintegrating tablet Take 1 tablet (4 mg total) by mouth every 8 (eight) hours as needed for nausea or vomiting. 09/14/20  Suzy Bouchard, PA-C  Prenatal Vit-Fe Fumarate-FA (PRENATAL MULTIVITAMIN) TABS tablet Take 1 tablet by mouth daily at 12 noon.    [provider]    Allergies    Morphine and related and Xulane [norelgestromin-eth estradiol]  Review of Systems   Review of Systems  Constitutional:  Negative for chills and fever.  Gastrointestinal:  Negative for nausea and vomiting.  Skin:  Positive for color change and rash.   Physical Exam Updated Vital Signs BP 134/83   Pulse 86   Temp 99.4 F (37.4 C) (Oral)   Resp 16   Ht 5\' 7"  (1.702 m)   Wt 107.5 kg   LMP 10/10/2020   SpO2 98%   BMI 37.12 kg/m   Physical Exam Vitals and nursing note reviewed.  Constitutional:      General: She is not in acute distress.    Appearance: She is well-developed.  HENT:     Head: Normocephalic and atraumatic.     Right Ear: External ear normal.     Left Ear: External ear  normal.  Eyes:     General: No scleral icterus.       Right eye: No discharge.        Left eye: No discharge.     Conjunctiva/sclera: Conjunctivae normal.  Neck:     Trachea: No tracheal deviation.  Cardiovascular:     Rate and Rhythm: Normal rate.  Pulmonary:     Effort: Pulmonary effort is normal. No respiratory distress.     Breath sounds: No stridor.  Abdominal:     General: There is no distension.  Musculoskeletal:        General: Tenderness present. No swelling or deformity.     Cervical back: Neck supple.  Skin:    General: Skin is warm and dry.     Findings: Erythema present. No rash.     Comments: Moderate tenderness, erythema, as well as about 5 cm of palpable fluctuance noted along the left axillary region.  No pustule noted.  No active drainage.  Neurological:     Mental Status: She is alert.     Cranial Nerves: Cranial nerve deficit: no gross deficits.    ED Results / Procedures / Treatments   Labs (all labs ordered are listed, but only abnormal results are displayed) Labs Reviewed  PREGNANCY, URINE    EKG None  Radiology No results found.  Procedures .Marland KitchenIncision and Drainage  Date/Time: 11/08/2020 9:10 PM Performed by: Rayna Sexton, PA-C Authorized by: Rayna Sexton, PA-C   Consent:    Consent obtained:  Verbal   Consent given by:  Patient   Risks discussed:  Bleeding, incomplete drainage, pain and infection Universal protocol:    Procedure explained and questions answered to patient or proxy's satisfaction: yes     Relevant documents present and verified: yes     Test results available : yes     Required blood products, implants, devices, and special equipment available: yes     Site/side marked: yes     Immediately prior to procedure, a time out was called: yes     Patient identity confirmed:  Verbally with patient Location:    Type:  Abscess   Size:  5 cm   Location:  Upper extremity   Upper extremity location: left  axilla. Pre-procedure details:    Skin preparation:  Antiseptic wash and chlorhexidine with alcohol Sedation:    Sedation type:  None Anesthesia:    Anesthesia method:  Local infiltration  Local anesthetic:  Lidocaine 2% WITH epi Procedure type:    Complexity:  Complex Procedure details:    Ultrasound guidance: no     Needle aspiration: no     Incision types:  Stab incision   Incision depth:  Dermal   Wound management:  Probed and deloculated and extensive cleaning   Drainage:  Purulent   Drainage amount:  Moderate   Wound treatment:  Wound left open   Packing materials:  None Post-procedure details:    Procedure completion:  Tolerated well, no immediate complications   Medications Ordered in ED Medications  lidocaine-EPINEPHrine (XYLOCAINE W/EPI) 2 %-1:200000 (PF) injection 10 mL (10 mLs Infiltration Given 11/08/20 2117)  acetaminophen (TYLENOL) tablet 650 mg (650 mg Oral Given 11/08/20 2243)   ED Course  I have reviewed the triage vital signs and the nursing notes.  Pertinent labs & imaging results that were available during my care of the patient were reviewed by me and considered in my medical decision making (see chart for details).    MDM Rules/Calculators/A&P                          Pt is a 29 y.o. female who presents to the emergency department with a developing abscess in the left axillary region.  Labs: Urine pregnancy test is negative.  I, Rayna Sexton, PA-C, personally reviewed and evaluated these images and lab results as part of my medical decision-making.  Abscessed incised producing a moderate amount of purulent discharge.  Patient notes moderate relief of her symptoms.  Please see procedure note above for additional details.  Patient's pregnancy test is negative.  Will discharge on a course of doxycycline.  We discussed return precautions at length.  Feel the patient is stable for discharge at this time and she was agreeable.  Her questions were  answered and she was amicable at the time of discharge.  Note: Portions of this report may have been transcribed using voice recognition software. Every effort was made to ensure accuracy; however, inadvertent computerized transcription errors may be present.   Final Clinical Impression(s) / ED Diagnoses Final diagnoses:  Abscess   Rx / DC Orders ED Discharge Orders          Ordered    doxycycline (VIBRAMYCIN) 100 MG capsule  2 times daily        11/08/20 2210             Rayna Sexton, PA-C 11/08/20 2303    Wynona Dove A, DO 11/09/20 [phone]

## 2020-11-08 NOTE — ED Provider Notes (Signed)
Emergency Medicine Provider Triage Evaluation Note  Elisheva Fallas , a 30 y.o. female  was evaluated in triage.  Pt complains of left axilla pain.  Started as a small bump, getting bigger and more painful.  No drainage.  No fevers.  No history of similar.  Review of Systems  Positive: L axilla lesions Negative: fever  Physical Exam  BP 129/79 (BP Location: Right Arm)   Pulse 100   Temp 98.5 F (36.9 C) (Oral)   Resp 16   Ht 5\' 7"  (1.702 m)   Wt 107.5 kg   LMP 10/10/2020   SpO2 98%   BMI 37.12 kg/m  Gen:   Awake, no distress   Resp:  Normal effort  MSK:   Moves extremities without difficulty  Other:  Abscess in the left axilla.  No drainage.  Medical Decision Making  Medically screening exam initiated at 4:56 PM.  Appropriate orders placed.  Evola Rhodes was informed that the remainder of the evaluation will be completed by another provider, this initial triage assessment does not replace that evaluation, and the importance of remaining in the ED until their evaluation is complete.     Franchot Heidelberg, PA-C 11/08/20 1705    Valarie Merino, MD 11/08/20 2352

## 2020-11-08 NOTE — Discharge Instructions (Addendum)
Please continue to apply warm compresses and take hot showers/baths.  This will help with the remaining pus drained from your wound.  I prescribed you an antibiotic called doxycycline.  Please take this twice a day for the next 7 days.  Do not stop taking this medication early.  If you develop any new or worsening symptoms please come back to the emergency department.  It was a pleasure to meet you.

## 2020-11-08 NOTE — ED Triage Notes (Signed)
Pt. Clo abscess under L. Arm since Friday that is getting larger and is associated with pain on movement.

## 2022-03-30 ENCOUNTER — Other Ambulatory Visit: Payer: Self-pay | Admitting: Obstetrics and Gynecology

## 2022-05-01 ENCOUNTER — Encounter (HOSPITAL_BASED_OUTPATIENT_CLINIC_OR_DEPARTMENT_OTHER): Payer: Self-pay | Admitting: Obstetrics and Gynecology

## 2022-05-01 NOTE — Progress Notes (Signed)
Spoke w/ via phone for pre-op interview--- Kelli Cobb needs dos---- UPT, surgeon orders pending              Cobb results------ COVID test -----patient states asymptomatic no test needed Arrive at -------0530 NPO after MN NO Solid Food.  Med rec completed Medications to take morning of surgery -----NONE Diabetic medication ----- Patient instructed no nail polish to be worn day of surgery Patient instructed to bring photo id and insurance card day of surgery Patient aware to have Driver (ride ) / caregiver  Husband Darrell  for 24 hours after surgery  Patient Special Instructions ----- Pre-Op special Istructions ----- patient does take Ozempic, last dose 04/24/22 verbalized understanding to hold Ozempic until after surgery. Patient verbalized understanding of instructions that were given at this phone interview. Patient denies shortness of breath, chest pain, fever, cough at this phone interview.

## 2022-05-04 ENCOUNTER — Other Ambulatory Visit: Payer: Self-pay | Admitting: Obstetrics and Gynecology

## 2022-05-05 ENCOUNTER — Ambulatory Visit (HOSPITAL_BASED_OUTPATIENT_CLINIC_OR_DEPARTMENT_OTHER)
Admission: RE | Admit: 2022-05-05 | Discharge: 2022-05-05 | Disposition: A | Discharge status: Home / Self Care | Payer: Managed Care, Other (non HMO) | Attending: Obstetrics and Gynecology | Admitting: Obstetrics and Gynecology

## 2022-05-05 ENCOUNTER — Encounter (HOSPITAL_BASED_OUTPATIENT_CLINIC_OR_DEPARTMENT_OTHER)
Admission: RE | Disposition: A | Discharge status: Home / Self Care | Payer: Self-pay | Source: Home / Self Care | Attending: Obstetrics and Gynecology

## 2022-05-05 ENCOUNTER — Other Ambulatory Visit: Payer: Self-pay

## 2022-05-05 ENCOUNTER — Ambulatory Visit (HOSPITAL_BASED_OUTPATIENT_CLINIC_OR_DEPARTMENT_OTHER): Payer: Managed Care, Other (non HMO) | Admitting: Anesthesiology

## 2022-05-05 ENCOUNTER — Encounter (HOSPITAL_BASED_OUTPATIENT_CLINIC_OR_DEPARTMENT_OTHER): Payer: Self-pay | Admitting: Obstetrics and Gynecology

## 2022-05-05 DIAGNOSIS — Z7984 Long term (current) use of oral hypoglycemic drugs: Secondary | ICD-10-CM | POA: Insufficient documentation

## 2022-05-05 DIAGNOSIS — N920 Excessive and frequent menstruation with regular cycle: Secondary | ICD-10-CM | POA: Diagnosis present

## 2022-05-05 DIAGNOSIS — E119 Type 2 diabetes mellitus without complications: Secondary | ICD-10-CM | POA: Diagnosis not present

## 2022-05-05 DIAGNOSIS — N921 Excessive and frequent menstruation with irregular cycle: Secondary | ICD-10-CM | POA: Diagnosis not present

## 2022-05-05 DIAGNOSIS — Z01818 Encounter for other preprocedural examination: Secondary | ICD-10-CM

## 2022-05-05 DIAGNOSIS — N840 Polyp of corpus uteri: Secondary | ICD-10-CM

## 2022-05-05 DIAGNOSIS — D252 Subserosal leiomyoma of uterus: Secondary | ICD-10-CM | POA: Diagnosis not present

## 2022-05-05 HISTORY — DX: Presence of spectacles and contact lenses: Z97.3

## 2022-05-05 HISTORY — PX: HYSTEROSCOPY WITH NOVASURE: SHX5574

## 2022-05-05 LAB — CBC
HCT: 40.6 % (ref 36.0–46.0)
Hemoglobin: 12.9 g/dL (ref 12.0–15.0)
MCH: 26.1 pg (ref 26.0–34.0)
MCHC: 31.8 g/dL (ref 30.0–36.0)
MCV: 82.2 fL (ref 80.0–100.0)
Platelets: 360 10*3/uL (ref 150–400)
RBC: 4.94 MIL/uL (ref 3.87–5.11)
RDW: 14.1 % (ref 11.5–15.5)
WBC: 3.6 10*3/uL — ABNORMAL LOW (ref 4.0–10.5)
nRBC: 0 % (ref 0.0–0.2)

## 2022-05-05 LAB — POCT PREGNANCY, URINE: Preg Test, Ur: NEGATIVE

## 2022-05-05 LAB — GLUCOSE, CAPILLARY
Glucose-Capillary: 86 mg/dL (ref 70–99)
Glucose-Capillary: 87 mg/dL (ref 70–99)

## 2022-05-05 SURGERY — HYSTEROSCOPY WITH NOVASURE
Anesthesia: General | Site: Vagina

## 2022-05-05 MED ORDER — AMISULPRIDE (ANTIEMETIC) 5 MG/2ML IV SOLN
10.0000 mg | Freq: Once | INTRAVENOUS | Status: AC
  Administered 2022-05-05: 10 mg via INTRAVENOUS

## 2022-05-05 MED ORDER — ACETAMINOPHEN 500 MG PO TABS
ORAL_TABLET | ORAL | Status: AC
  Filled 2022-05-05: qty 2

## 2022-05-05 MED ORDER — MIDAZOLAM HCL 5 MG/5ML IJ SOLN
INTRAMUSCULAR | Status: DC | PRN
  Administered 2022-05-05: 2 mg via INTRAVENOUS

## 2022-05-05 MED ORDER — DEXAMETHASONE SODIUM PHOSPHATE 10 MG/ML IJ SOLN
INTRAMUSCULAR | Status: DC | PRN
  Administered 2022-05-05: 10 mg via INTRAVENOUS

## 2022-05-05 MED ORDER — PROPOFOL 10 MG/ML IV BOLUS
INTRAVENOUS | Status: AC
  Filled 2022-05-05: qty 20

## 2022-05-05 MED ORDER — LACTATED RINGERS IV SOLN: INTRAVENOUS | Status: DC

## 2022-05-05 MED ORDER — FENTANYL CITRATE (PF) 100 MCG/2ML IJ SOLN
INTRAMUSCULAR | Status: DC | PRN
  Administered 2022-05-05 (×2): 50 ug via INTRAVENOUS

## 2022-05-05 MED ORDER — MIDAZOLAM HCL 2 MG/2ML IJ SOLN
INTRAMUSCULAR | Status: AC
  Filled 2022-05-05: qty 2

## 2022-05-05 MED ORDER — ONDANSETRON HCL 4 MG/2ML IJ SOLN
INTRAMUSCULAR | Status: DC | PRN
  Administered 2022-05-05: 4 mg via INTRAVENOUS

## 2022-05-05 MED ORDER — LIDOCAINE 2% (20 MG/ML) 5 ML SYRINGE
INTRAMUSCULAR | Status: DC | PRN
  Administered 2022-05-05: 60 mg via INTRAVENOUS

## 2022-05-05 MED ORDER — LIDOCAINE HCL (PF) 2 % IJ SOLN
INTRAMUSCULAR | Status: AC
  Filled 2022-05-05: qty 5

## 2022-05-05 MED ORDER — DEXAMETHASONE SODIUM PHOSPHATE 10 MG/ML IJ SOLN
INTRAMUSCULAR | Status: AC
  Filled 2022-05-05: qty 1

## 2022-05-05 MED ORDER — IBUPROFEN 800 MG PO TABS: 800.0000 mg | ORAL_TABLET | Freq: Three times a day (TID) | ORAL | 9 refills | Status: AC | PRN

## 2022-05-05 MED ORDER — KETOROLAC TROMETHAMINE 30 MG/ML IJ SOLN
INTRAMUSCULAR | Status: AC
  Filled 2022-05-05: qty 1

## 2022-05-05 MED ORDER — PROPOFOL 10 MG/ML IV BOLUS
INTRAVENOUS | Status: DC | PRN
  Administered 2022-05-05: 50 mg via INTRAVENOUS
  Administered 2022-05-05: 200 mg via INTRAVENOUS
  Administered 2022-05-05: 50 mg via INTRAVENOUS

## 2022-05-05 MED ORDER — FENTANYL CITRATE (PF) 100 MCG/2ML IJ SOLN
INTRAMUSCULAR | Status: AC
  Filled 2022-05-05: qty 2

## 2022-05-05 MED ORDER — FENTANYL CITRATE (PF) 100 MCG/2ML IJ SOLN: 25.0000 ug | INTRAMUSCULAR | Status: DC | PRN

## 2022-05-05 MED ORDER — SODIUM CHLORIDE 0.9 % IR SOLN
Status: DC | PRN
  Administered 2022-05-05: 3000 mL

## 2022-05-05 MED ORDER — AMISULPRIDE (ANTIEMETIC) 5 MG/2ML IV SOLN
INTRAVENOUS | Status: AC
  Filled 2022-05-05: qty 4

## 2022-05-05 MED ORDER — KETOROLAC TROMETHAMINE 30 MG/ML IJ SOLN
INTRAMUSCULAR | Status: DC | PRN
  Administered 2022-05-05: 30 mg via INTRAVENOUS

## 2022-05-05 MED ORDER — POVIDONE-IODINE 10 % EX SWAB: 2.0000 | Freq: Once | CUTANEOUS | Status: DC

## 2022-05-05 MED ORDER — ACETAMINOPHEN 500 MG PO TABS
1000.0000 mg | ORAL_TABLET | Freq: Once | ORAL | Status: AC
  Administered 2022-05-05: 1000 mg via ORAL

## 2022-05-05 SURGICAL SUPPLY — 21 items
ABLATOR SURESOUND NOVASURE (ABLATOR) IMPLANT
CATH ROBINSON RED A/P 16FR (CATHETERS) IMPLANT
DEVICE MYOSURE REACH (MISCELLANEOUS) IMPLANT
DILATOR CANAL MILEX (MISCELLANEOUS) IMPLANT
DRSG TELFA 3X8 NADH STRL (GAUZE/BANDAGES/DRESSINGS) ×1 IMPLANT
GAUZE 4X4 16PLY ~~LOC~~+RFID DBL (SPONGE) ×2 IMPLANT
GAUZE PETROLATUM 1 X8 (GAUZE/BANDAGES/DRESSINGS) IMPLANT
GLOVE BIOGEL PI IND STRL 7.0 (GLOVE) ×1 IMPLANT
GLOVE ECLIPSE 6.5 STRL STRAW (GLOVE) ×1 IMPLANT
GOWN STRL REUS W/TWL LRG LVL3 (GOWN DISPOSABLE) ×1 IMPLANT
IV NS IRRIG 3000ML ARTHROMATIC (IV SOLUTION) ×1 IMPLANT
KIT PROCEDURE FLUENT (KITS) ×1 IMPLANT
KIT TURNOVER CYSTO (KITS) ×1 IMPLANT
LOOP CUTTING BIPOLAR 21FR (ELECTRODE) IMPLANT
PACK VAGINAL MINOR WOMEN LF (CUSTOM PROCEDURE TRAY) ×1 IMPLANT
PAD OB MATERNITY 4.3X12.25 (PERSONAL CARE ITEMS) ×1 IMPLANT
PAD PREP 24X48 CUFFED NSTRL (MISCELLANEOUS) ×1 IMPLANT
SEAL ROD LENS SCOPE MYOSURE (ABLATOR) ×2 IMPLANT
SLEEVE SCD COMPRESS KNEE MED (STOCKING) ×1 IMPLANT
TOWEL OR 17X24 6PK STRL BLUE (TOWEL DISPOSABLE) ×1 IMPLANT
WATER STERILE IRR 500ML POUR (IV SOLUTION) ×1 IMPLANT

## 2022-05-05 NOTE — Interval H&P Note (Signed)
History and Physical Interval Note:  05/05/2022 7:53 AM  Kelli Cobb  has presented today for surgery, with the diagnosis of Menometrorrhagia, subserous fibroids, endometrial polyp.  The various methods of treatment have been discussed with the patient and family. After consideration of risks, benefits and other options for treatment, the patient has consented to  Procedure" diagnostic hysteroscopy, hysteroscopic resection of endometrial polyps using myosure, dilation and curettagec as a surgical intervention.  The patient's history has been reviewed, patient examined, no change in status, stable for surgery.  I have reviewed the patient's chart and labs.  Questions were answered to the patient's satisfaction.     Zaila Crew A Caylor Tallarico

## 2022-05-05 NOTE — Anesthesia Preprocedure Evaluation (Signed)
Anesthesia Evaluation  Patient identified by MRN, date of birth, ID band Patient awake    Reviewed: Allergy & Precautions, NPO status , Patient's Chart, lab work & pertinent test results  Airway Mallampati: II  TM Distance: >3 FB Neck ROM: Full    Dental no notable dental hx.    Pulmonary neg pulmonary ROS   Pulmonary exam normal breath sounds clear to auscultation       Cardiovascular negative cardio ROS Normal cardiovascular exam Rhythm:Regular Rate:Normal     Neuro/Psych  PSYCHIATRIC DISORDERS Anxiety     negative neurological ROS     GI/Hepatic negative GI ROS, Neg liver ROS,,,  Endo/Other  diabetes, Type 2, Oral Hypoglycemic Agents    Renal/GU negative Renal ROS  negative genitourinary   Musculoskeletal negative musculoskeletal ROS (+)    Abdominal   Peds  Hematology negative hematology ROS (+)   Anesthesia Other Findings   Reproductive/Obstetrics                             Anesthesia Physical Anesthesia Plan  ASA: 2  Anesthesia Plan: General   Post-op Pain Management: Tylenol PO (pre-op)*   Induction: Intravenous  PONV Risk Score and Plan: 3 and Ondansetron, Dexamethasone and Midazolam  Airway Management Planned: LMA  Additional Equipment:   Intra-op Plan:   Post-operative Plan: Extubation in OR  Informed Consent: I have reviewed the patients History and Physical, chart, labs and discussed the procedure including the risks, benefits and alternatives for the proposed anesthesia with the patient or authorized representative who has indicated his/her understanding and acceptance.     Dental advisory given  Plan Discussed with: CRNA  Anesthesia Plan Comments:        Anesthesia Quick Evaluation

## 2022-05-05 NOTE — H&P (Signed)
Kelli Cobb is an 32 y.o. female. BF presents for surgical management of heavy vaginal bleeding with endometrial polyps on sonogram. Pt is scheduled for dx hysteroscopy, hysteroscopic resection of endometrial polyp using myosure, d&C  Pertinent Gynecological History: Menses: flow is excessive with use of 5 pads or tampons on heaviest days Bleeding: menorhagia Contraception: none DES exposure: denies Blood transfusions: none Sexually transmitted diseases: no past history Previous GYN Procedures:  none   Last mammogram:  n/a  Date: n/a Last pap: normal Date: 2024    Menstrual History: Menarche age: n/a Patient's last menstrual period was 04/20/2022.    Past Medical History:  Diagnosis Date   Anemia    Anxiety    External hemorrhoids    Fibroid    Fibroids    Gestational diabetes    Syncope    Wears contact lenses     Past Surgical History:  Procedure Laterality Date   NO PAST SURGERIES      Family History  Problem Relation Age of Onset   Clotting disorder Mother    Hypertension Mother    Diabetes Paternal Grandmother    Hypertension Paternal Grandmother    Hypertension Maternal Grandmother    Hypertension Maternal Grandfather    Colon cancer Paternal Grandfather    Hypertension Paternal Grandfather     Social History:  reports that she has never smoked. She has never used smokeless tobacco. She reports that she does not currently use alcohol. She reports that she does not use drugs.  Allergies:  Allergies  Allergen Reactions   Morphine And Related Itching   Xulane [Norelgestromin-Eth Estradiol] Dermatitis    No medications prior to admission.    Review of Systems  All other systems reviewed and are negative.   Height 5\' 7"  (1.702 m), weight 86.6 kg, last menstrual period 04/20/2022, unknown if currently breastfeeding. Physical Exam Constitutional:      Appearance: Normal appearance.  HENT:     Head: Atraumatic.  Eyes:     Extraocular Movements:  Extraocular movements intact.  Cardiovascular:     Rate and Rhythm: Regular rhythm.  Pulmonary:     Breath sounds: Normal breath sounds.  Abdominal:     Palpations: Abdomen is soft.  Genitourinary:    Comments: Vulva nl Vagina no discharge or lesion Cervix: parous Uterus irreg And. No palp mass Musculoskeletal:        General: Normal range of motion.     Cervical back: Neck supple.  Skin:    General: Skin is warm and dry.  Neurological:     Mental Status: She is alert.  Psychiatric:        Mood and Affect: Mood normal.        Behavior: Behavior normal.     No results found for this or any previous visit (from the past 24 hour(s)).  No results found.  Assessment/Plan: Menorrhagia  Endometrial polyps P) dx hysteroscopy, hysteroscopic resection of endometrial polyps using myosure, D&C. Procedure explained. Risk of surgery reviewed including infection, bleeding, injury to surrounding organ structures, thermal injury, fluid overload and its mgmt, thermal injury, uterine perforation and its risk. All ? answered  Tanayia Wahlquist A Cadyn Fann 05/05/2022, 3:39 AM

## 2022-05-05 NOTE — Anesthesia Procedure Notes (Signed)
Procedure Name: LMA Insertion Date/Time: 05/05/2022 8:06 AM  Performed by: Bonney Aid, CRNAPre-anesthesia Checklist: Patient identified, Emergency Drugs available, Suction available and Patient being monitored Patient Re-evaluated:Patient Re-evaluated prior to induction Oxygen Delivery Method: Circle system utilized Preoxygenation: Pre-oxygenation with 100% oxygen Induction Type: IV induction Ventilation: Mask ventilation without difficulty LMA: LMA inserted LMA Size: 4.0 Number of attempts: 1 Airway Equipment and Method: Bite block Placement Confirmation: positive ETCO2 Dental Injury: Teeth and Oropharynx as per pre-operative assessment

## 2022-05-05 NOTE — Anesthesia Postprocedure Evaluation (Signed)
Anesthesia Post Note  Patient: Haematologist  Procedure(s) Performed: DIAGNOSTIC HYSTEROSCOPY WITH MYOSURE RESECTION OF ENDOMETRIAL POLYPS AND DILATION AND CURETTAGE (Vagina )     Patient location during evaluation: PACU Anesthesia Type: General Level of consciousness: awake and alert Pain management: pain level controlled Vital Signs Assessment: post-procedure vital signs reviewed and stable Respiratory status: spontaneous breathing, nonlabored ventilation, respiratory function stable and patient connected to nasal cannula oxygen Cardiovascular status: blood pressure returned to baseline and stable Postop Assessment: no apparent nausea or vomiting Anesthetic complications: no  No notable events documented.  Last Vitals:  Vitals:   05/05/22 0930 05/05/22 0945  BP: 123/80 126/81  Pulse: 70 71  Resp: 17 13  Temp:    SpO2: 99% 100%    Last Pain:  Vitals:   05/05/22 0915  TempSrc:   PainSc: 0-No pain                 Rehmat Murtagh L Athziry Millican

## 2022-05-05 NOTE — Transfer of Care (Signed)
Immediate Anesthesia Transfer of Care Note  Patient: Kelli Cobb  Procedure(s) Performed: DIAGNOSTIC HYSTEROSCOPY WITH MYOSURE RESECTION OF ENDOMETRIAL POLYPS AND DILATION AND CURETTAGE (Vagina )  Patient Location: PACU  Anesthesia Type:General  Level of Consciousness: drowsy  Airway & Oxygen Therapy: Patient Spontanous Breathing and Patient connected to nasal cannula oxygen  Post-op Assessment: Report given to RN  Post vital signs: Reviewed and stable  Last Vitals:  Vitals Value Taken Time  BP 118/82   Temp    Pulse 94 05/05/22 0843  Resp 12 05/05/22 0843  SpO2 100 % 05/05/22 0843  Vitals shown include unvalidated device data.  Last Pain:  Vitals:   05/05/22 0556  TempSrc: Oral  PainSc: 0-No pain      Patients Stated Pain Goal: 5 ([id number])  Complications: No notable events documented.

## 2022-05-05 NOTE — Discharge Instructions (Addendum)
CALL  IF TEMP>100.4, NOTHING PER VAGINA X 1 WK, CALL IF SOAKING A MAXI  PAD EVERY HOUR OR MORE FREQUENTLY        No acetaminophen/Tylenol until after 1:30 pm today if needed.  No ibuprofen, Advil, Aleve, Motrin, ketorolac, meloxicam, naproxen, or other NSAIDS until after 2:15 pm today if needed.   Post Anesthesia Home Care Instructions  Activity: Get plenty of rest for the remainder of the day. A responsible individual must stay with you for 24 hours following the procedure.  For the next 24 hours, DO NOT: -Drive a car -Paediatric nurse -Drink alcoholic beverages -Take any medication unless instructed by your physician -Make any legal decisions or sign important papers.  Meals: Start with liquid foods such as gelatin or soup. Progress to regular foods as tolerated. Avoid greasy, spicy, heavy foods. If nausea and/or vomiting occur, drink only clear liquids until the nausea and/or vomiting subsides. Call your physician if vomiting continues.  Special Instructions/Symptoms: Your throat may feel dry or sore from the anesthesia or the breathing tube placed in your throat during surgery. If this causes discomfort, gargle with warm salt water. The discomfort should disappear within 24 hours.

## 2022-05-05 NOTE — Brief Op Note (Signed)
05/05/2022  8:48 AM  PATIENT:  Kelli Cobb  32 y.o. female  PRE-OPERATIVE DIAGNOSIS:  Menometrorrhagia, subserous fibroids, endometrial polyp  POST-OPERATIVE DIAGNOSIS:  Menometrorrhagia, subserous fibroids, endometrial polyps  PROCEDURE:  diagnostic hysteroscopy, hysteroscopic resection of endometrial polyp using Myosure,dilation and curettage  SURGEON:  Surgeon(s) and Role:    * Servando Salina, MD - Primary  PHYSICIAN ASSISTANT:   ASSISTANTS: none   ANESTHESIA:   general  EBL:  5 mL   BLOOD ADMINISTERED:none  DRAINS: none   LOCAL MEDICATIONS USED:  NONE  SPECIMEN:  Source of Specimen:  emc with polyps  DISPOSITION OF SPECIMEN:  PATHOLOGY  COUNTS:  YES  TOURNIQUET:  * No tourniquets in log *  DICTATION: .Other Dictation: Dictation Number [id number]  PLAN OF CARE: Discharge to home after PACU  PATIENT DISPOSITION:  PACU - hemodynamically stable.   Delay start of Pharmacological VTE agent (>24hrs) due to surgical blood loss or risk of bleeding: no

## 2022-05-05 NOTE — Op Note (Unsigned)
Kelli Cobb, Kelli Cobb MEDICAL RECORD NO: [id number] ACCOUNT NO: [id number] DATE OF BIRTH: [birth year] FACILITY: [facility] LOCATION: [facility] PHYSICIAN: Elicia Lui A. Garwin Brothers, MD  Operative Report   DATE OF PROCEDURE: 05/05/2022  PREOPERATIVE DIAGNOSES:  Menometrorrhagia, subserosal fibroids, endometrial polyps.  PROCEDURE:  Diagnostic hysteroscopy, hysteroscopic resection of endometrial polyps, dilation and curettage.  POSTOPERATIVE DIAGNOSES:  Menometrorrhagia, subserosal fibroids, endometrial polyps.  ANESTHESIA:  General.  SURGEON:  Servando Salina, MD  ASSISTANT:  None.  DESCRIPTION OF PROCEDURE:  Under adequate general anesthesia, the patient was placed in the dorsal lithotomy position.  She was sterilely prepped and draped in the usual fashion.  The patient had voided prior to entering the room and therefore she was  not catheterized.  Bivalve speculum was placed in the vagina.  A single-tooth tenaculum was placed on the anterior lip of the cervix.  The cervix was dilated up to #[facility] dilator.  Diagnostic hysteroscope was introduced into the uterine cavity.  The  left tubal ostia was seen well.  The right was not as clear.  Several polypoid lesions were noted throughout the cavity.  The REACH resectoscope was introduced into the uterine cavity.  All polypoid lesions, endometrium was resected.  When this resection  was felt to be complete, the endocervical canal was inspected and no lesions noted.  All the instruments were then removed from the vagina.  Specimen labeled endometrial curetting with polyp was sent to pathology.  ESTIMATED BLOOD LOSS:  5 mL.  INTRAOPERATIVE FLUIDS:  1 liter.  FLUID DEFICIT:  155 mL.  COMPLICATIONS:  None.  DISPOSITION:  The patient tolerated the procedure well and was transferred to recovery room in stable condition.   SHW D: 05/05/2022 8:52:29 am T: 05/05/2022 9:08:00 am  JOB: [id number]

## 2022-05-08 ENCOUNTER — Encounter (HOSPITAL_BASED_OUTPATIENT_CLINIC_OR_DEPARTMENT_OTHER): Payer: Self-pay | Admitting: Obstetrics and Gynecology

## 2022-05-08 LAB — SURGICAL PATHOLOGY

## 2022-12-03 IMAGING — CT CT ABD-PELV W/ CM
2 of 4 series · 17 of 46 positions shown, 19 images · IV contrast (omnipaque)
Comparison: MRI 01/15/2019

CLINICAL DATA: Right lower quadrant abdominal pain. Appendicitis
suspected. Nausea and vomiting.

EXAM:
CT ABDOMEN AND PELVIS WITH CONTRAST
TECHNIQUE: Multidetector CT imaging of the abdomen and pelvis was performed
using the standard protocol following bolus administration of
intravenous contrast.
CONTRAST:  80mL OMNIPAQUE IOHEXOL 350 MG/ML SOLN

[Series 2: axial st · axial · 0.68mm/px · z∈[-407,-27]mm · 14 of 86 slices shown, 16 images]
[im 5/86  soft-tissue]
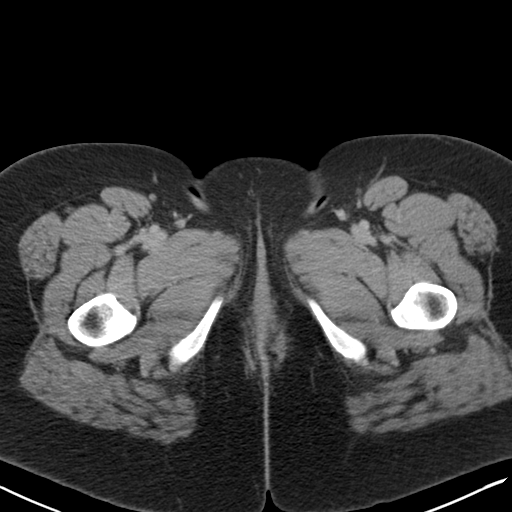
[im 5/86  bone]
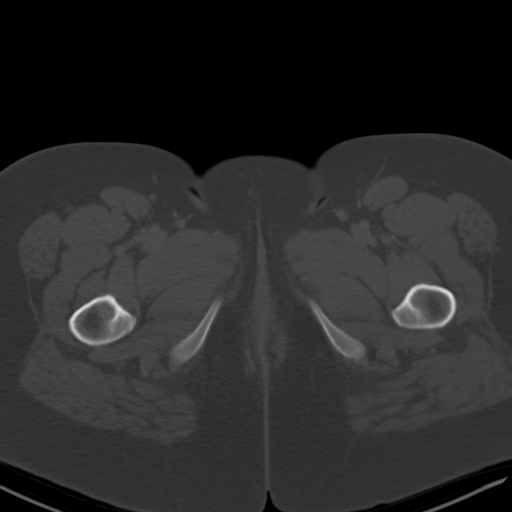
[im 9/86  soft-tissue]
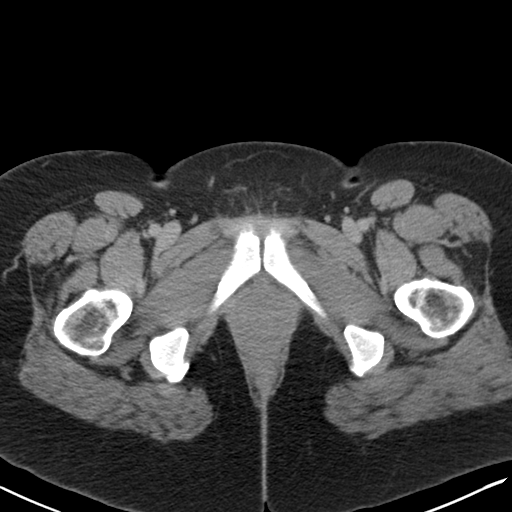
[im 18/86  soft-tissue]
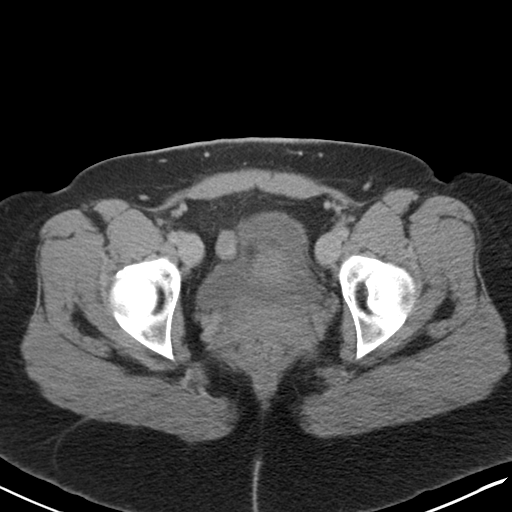
[im 23/86  soft-tissue]
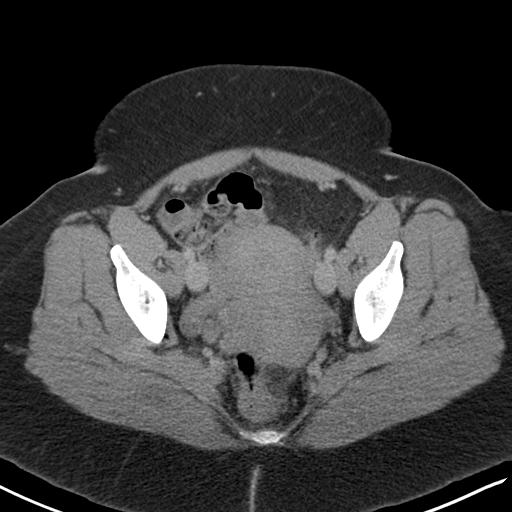
[im 27/86  soft-tissue]
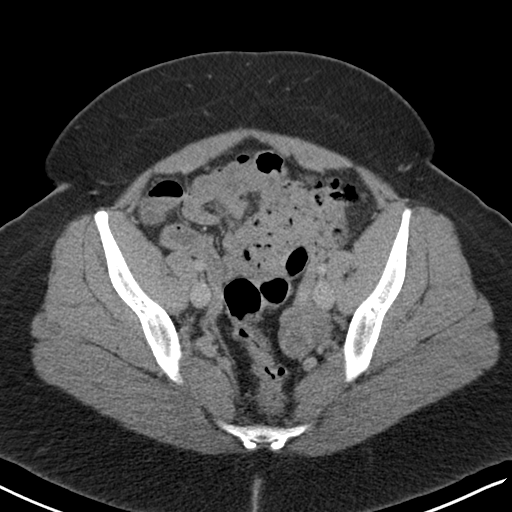
[im 36/86  soft-tissue]
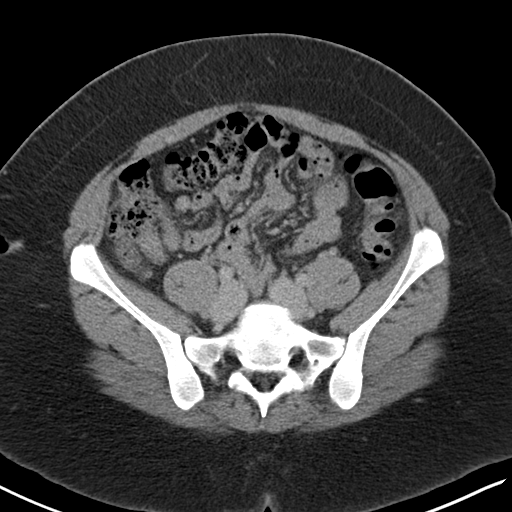
[im 41/86  soft-tissue]
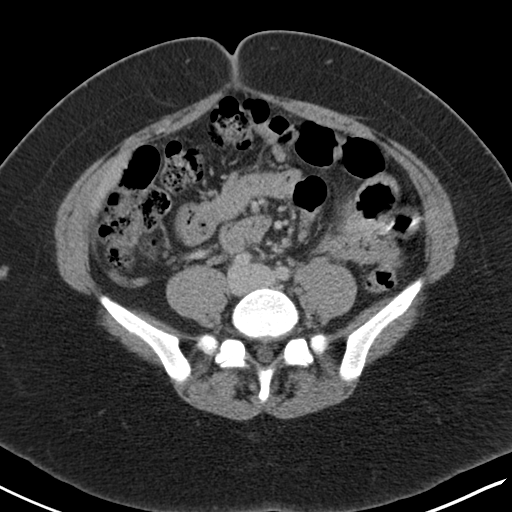
[im 45/86  soft-tissue]
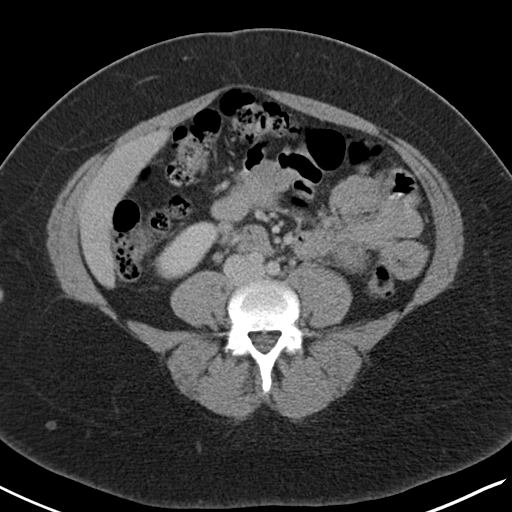
[im 50/86  soft-tissue]
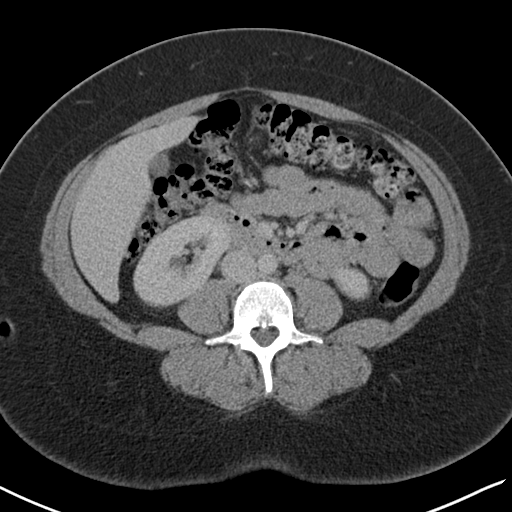
[im 50/86  bone]
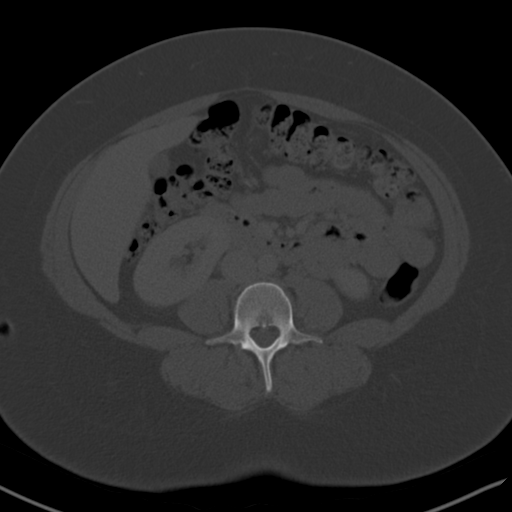
[im 59/86  soft-tissue]
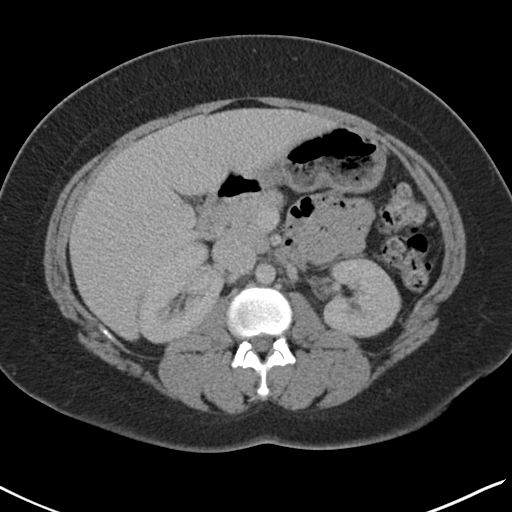
[im 63/86  soft-tissue]
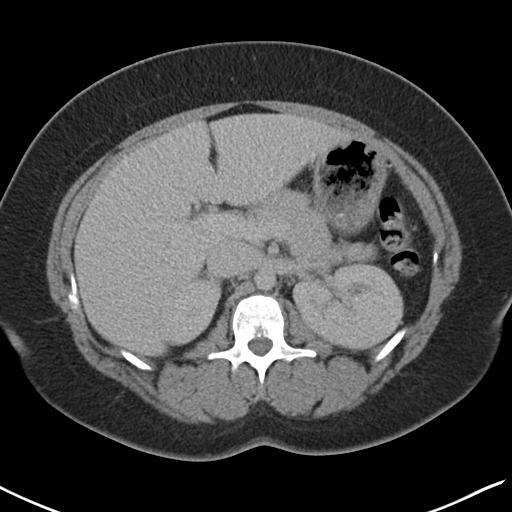
[im 68/86  soft-tissue]
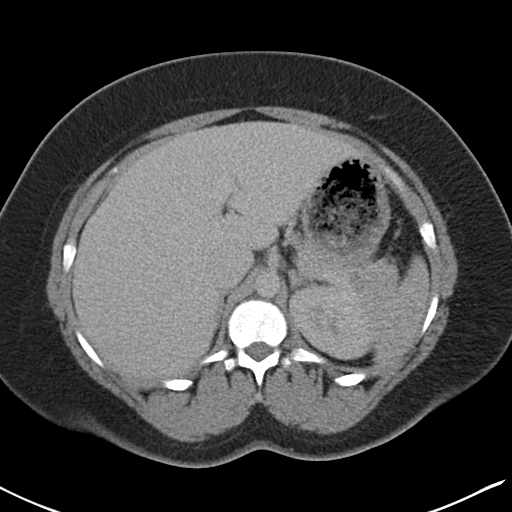
[im 77/86  soft-tissue]
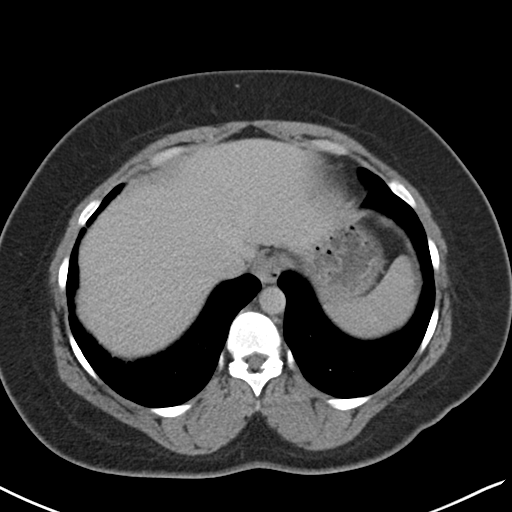
[im 81/86  soft-tissue]
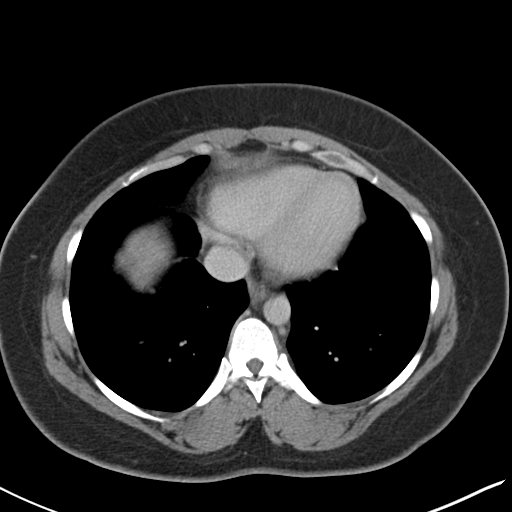

[Series 5: coronal st · coronal · 0.73mm/px · 3 of 166 slices shown]
[im 56/166  soft-tissue]
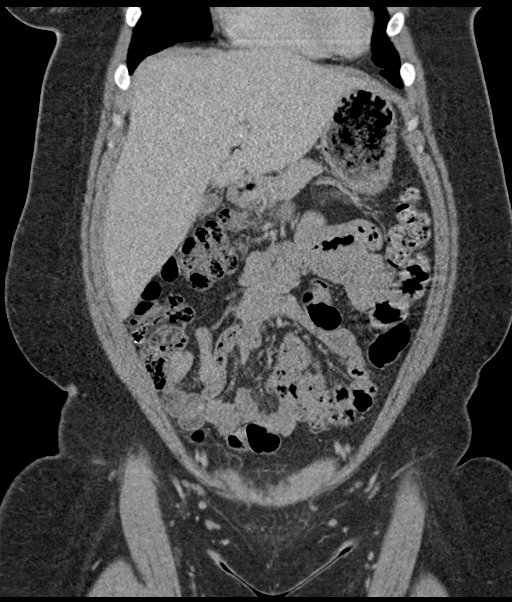
[im 74/166  soft-tissue]
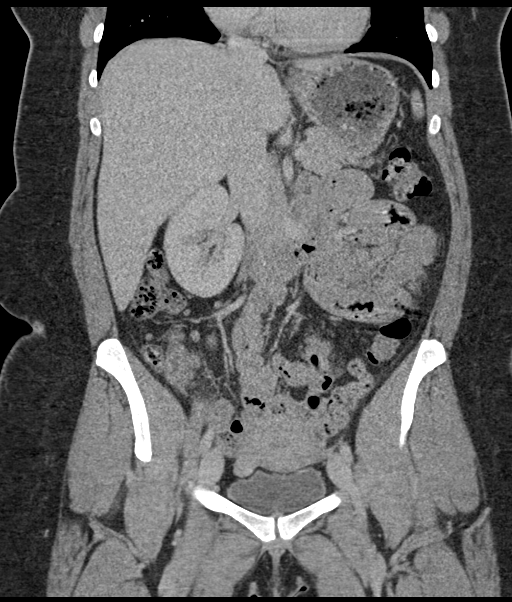
[im 92/166  soft-tissue]
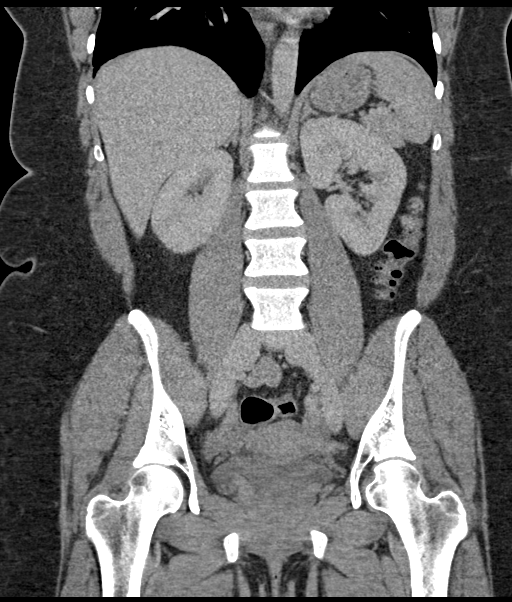

[17 of 46 positions shown; findings below may reference images not displayed]

FINDINGS: Lower chest: Negative

Hepatobiliary: Normal

Pancreas: Normal

Spleen: Normal

Adrenals/Urinary Tract: Adrenal glands are normal. Kidneys are
normal. No cyst, mass, stone or hydronephrosis. Bladder is normal.

Stomach/Bowel: Stomach and small intestine are normal. The appendix
is normal. No inflammatory change, appendicoliths or dilatation. No
colon abnormality otherwise.

Vascular/Lymphatic: Normal

Reproductive: Normal. No pelvic mass. There does appear to be a
small leiomyoma exophytic from the uterus to the right, measuring
only 2 cm. Previously seen large exophytic leiomyoma has apparently
been resected.

Other: No free fluid or air.

Musculoskeletal: Normal
IMPRESSION: Negative study. Normal appearing appendix. No abnormality seen to
explain right lower quadrant pain.

## 2022-12-03 IMAGING — US US PELVIS COMPLETE
1 series · 13 of 25 positions shown · non-contrast
Comparison: CT of earlier today.

CLINICAL DATA: Pelvic pain.

EXAM:
TRANSABDOMINAL AND TRANSVAGINAL ULTRASOUND OF PELVIS
DOPPLER ULTRASOUND OF OVARIES
TECHNIQUE: Both transabdominal and transvaginal ultrasound examinations of the
pelvis were performed. Transabdominal technique was performed for
global imaging of the pelvis including uterus, ovaries, adnexal
regions, and pelvic cul-de-sac.
It was necessary to proceed with endovaginal exam following the
transabdominal exam to visualize the uterus, ovaries, and adnexa.
Color and duplex Doppler ultrasound was utilized to evaluate blood
flow to the ovaries.

[Series 1: us pelvis complete · 13 of 74 slices shown]
[im 1/74]
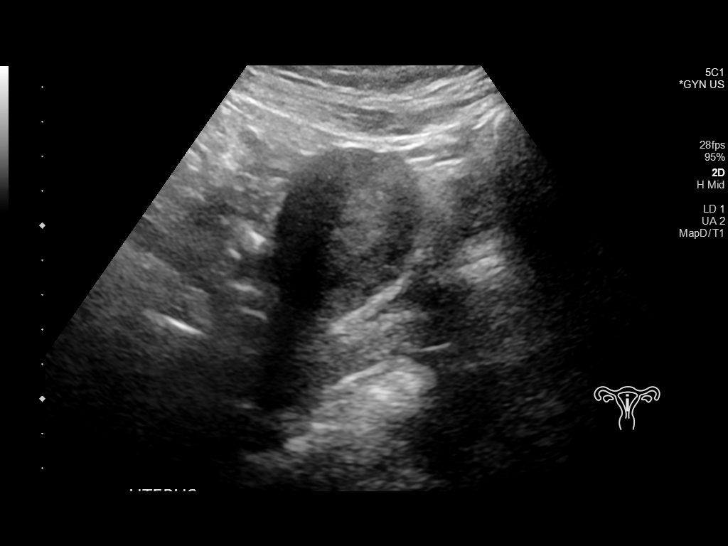
[im 7/74]
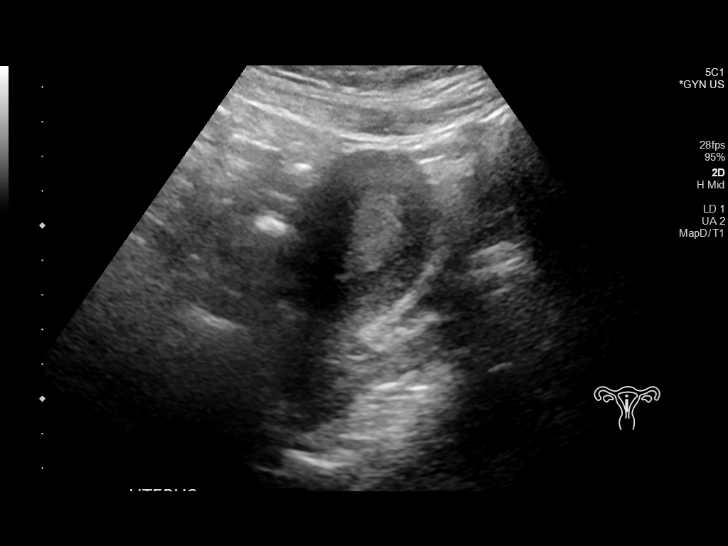
[im 13/74]
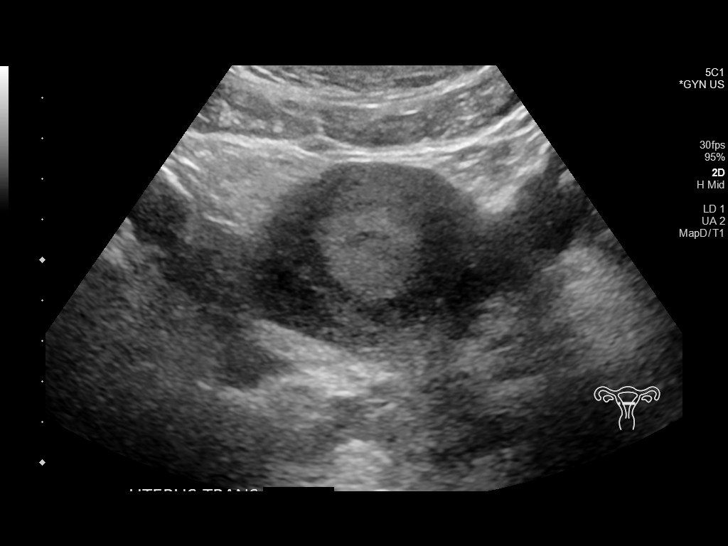
[im 19/74]
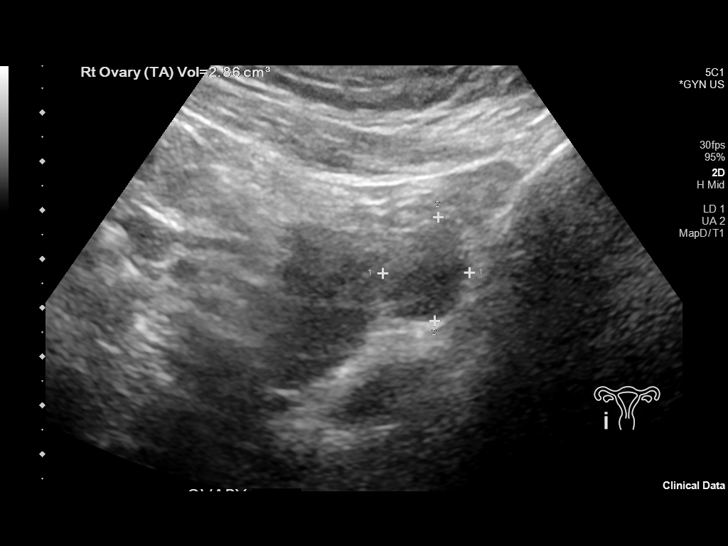
[im 25/74]
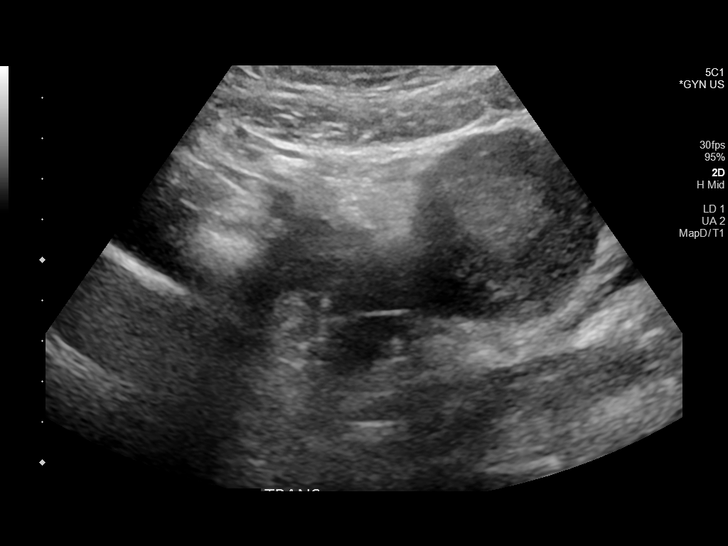
[im 31/74]
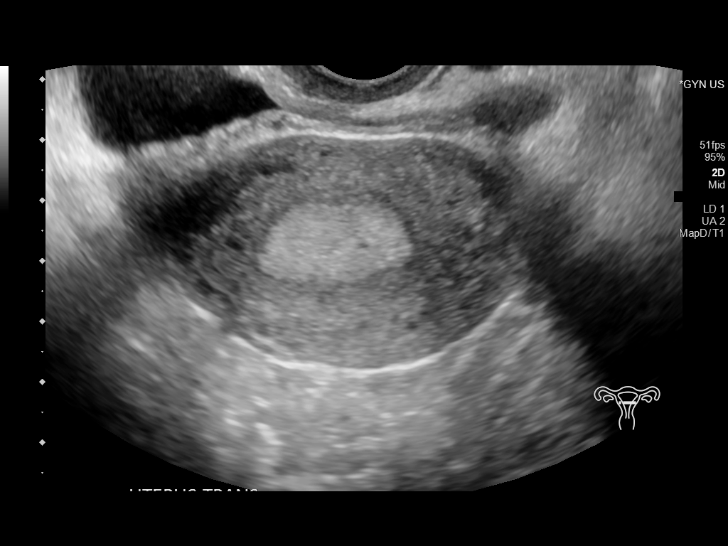
[im 37/74]
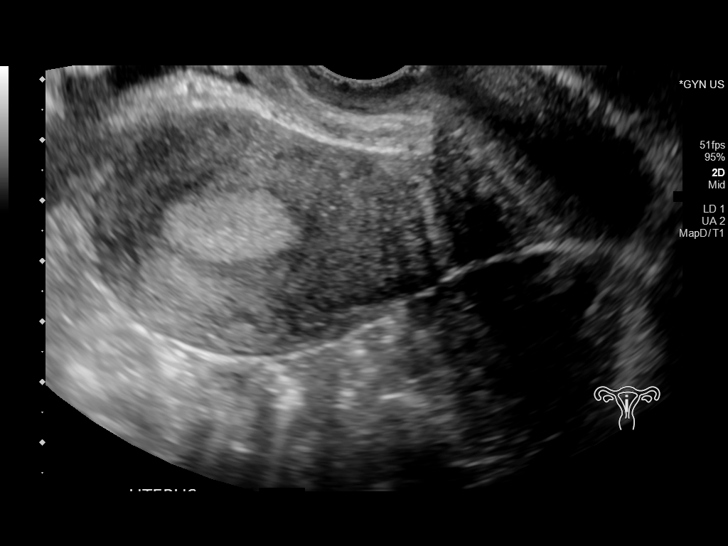
[im 43/74]
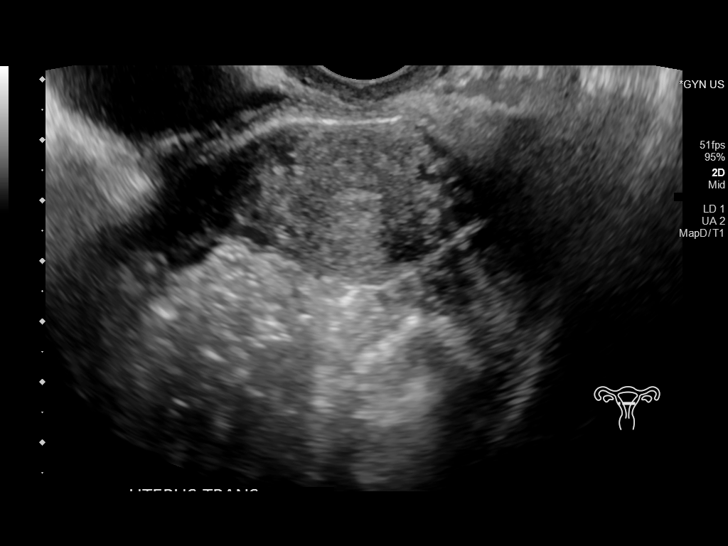
[im 49/74]
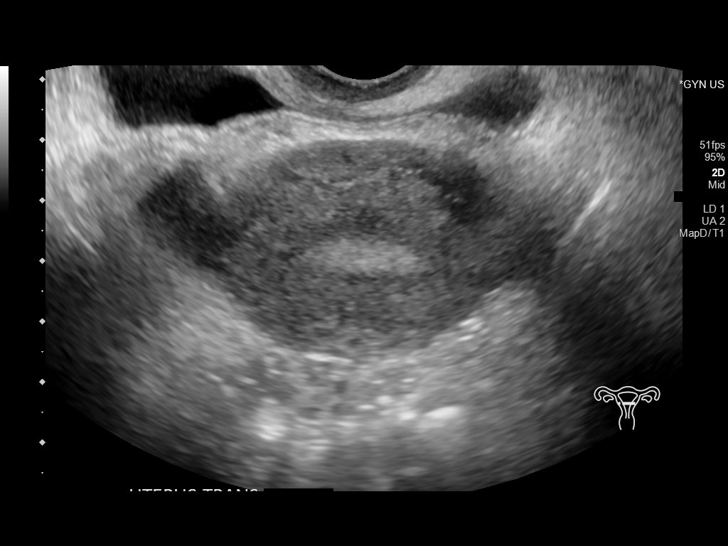
[im 55/74]
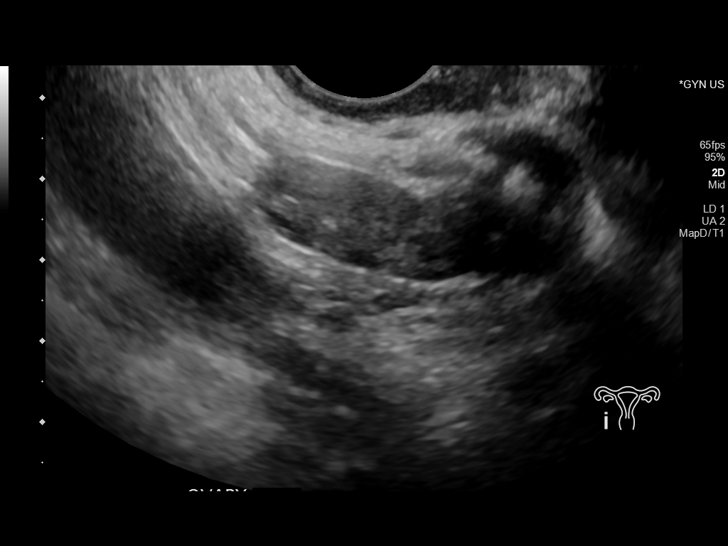
[im 61/74]
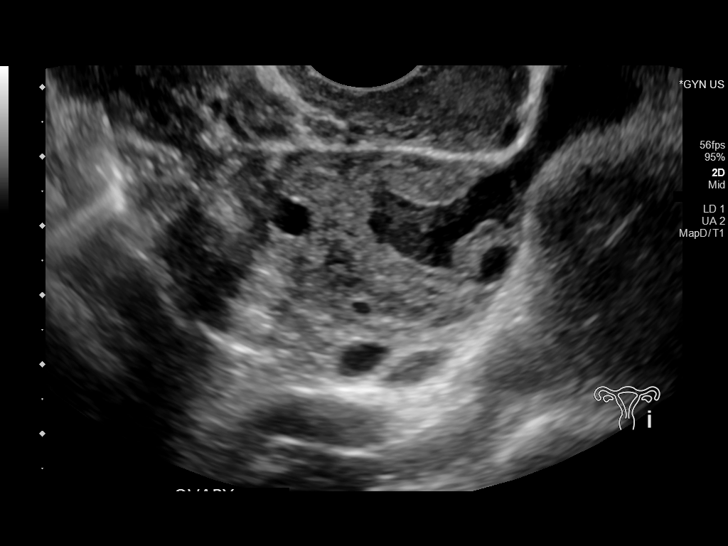
[im 67/74]
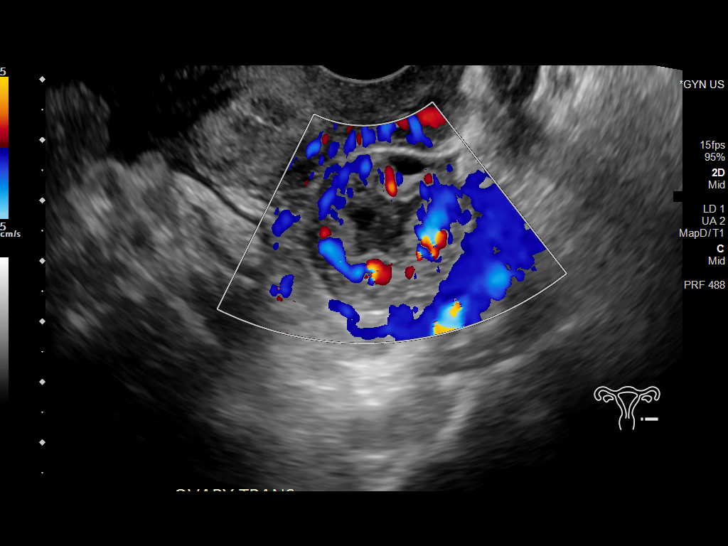
[im 74/74]
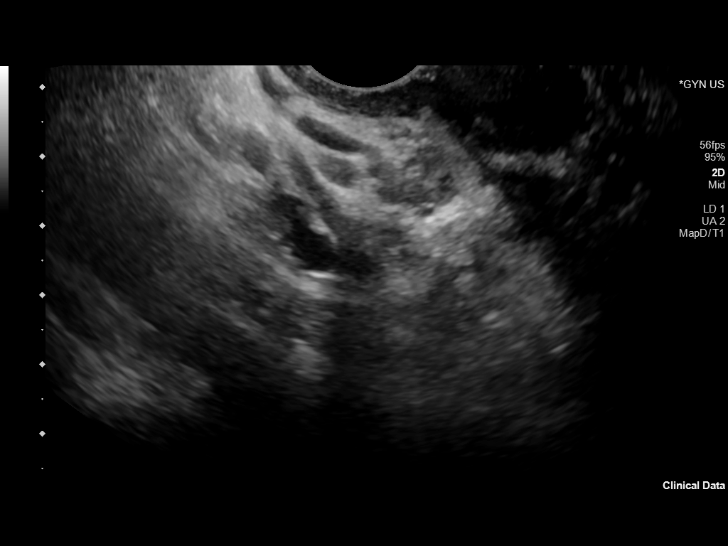

[13 of 25 positions shown; findings below may reference images not displayed]

FINDINGS: Uterus

Measurements: 9.5 x 4.1 x 5.9 cm = volume: 121 mL. No fibroids or
other mass visualized.

Endometrium

Thickness: Normal, 12 mm. Minimal fluid within is likely
physiologic.

Right ovary

Measurements: 2.7 x 1.2 x 2.3 cm = volume: 3.6 mL. Normal
appearance/no adnexal mass.

Left ovary

Measurements: 4.0 x 2.7 x 3.2 cm = volume: 18 0.2 mL. 2.6 x 2.3 x
2.3 cm corpus luteal cyst within, including on image 67.

Pulsed Doppler evaluation of both ovaries demonstrates normal
low-resistance arterial and venous waveforms.

Other findings

Trace free pelvic fluid is likely physiologic.
IMPRESSION: Left ovarian corpus luteal cyst. Trace cul-de-sac fluid which is
likely physiologic or related to cyst rupture.
